# Patient Record
Sex: Male | Born: 1935 | Race: White | Hispanic: No | Marital: Married | Smoking: Former smoker
Health system: Southern US, Community
[De-identification: ages and names within clinical notes are randomized; demographics above are authoritative.]

## PROBLEM LIST (undated history)

## (undated) DIAGNOSIS — M810 Age-related osteoporosis without current pathological fracture: Secondary | ICD-10-CM

## (undated) DIAGNOSIS — G459 Transient cerebral ischemic attack, unspecified: Secondary | ICD-10-CM

## (undated) DIAGNOSIS — Z85038 Personal history of other malignant neoplasm of large intestine: Secondary | ICD-10-CM

## (undated) DIAGNOSIS — Z87442 Personal history of urinary calculi: Secondary | ICD-10-CM

## (undated) DIAGNOSIS — I509 Heart failure, unspecified: Secondary | ICD-10-CM

## (undated) DIAGNOSIS — R197 Diarrhea, unspecified: Secondary | ICD-10-CM

## (undated) DIAGNOSIS — Z8546 Personal history of malignant neoplasm of prostate: Secondary | ICD-10-CM

## (undated) DIAGNOSIS — K520 Gastroenteritis and colitis due to radiation: Secondary | ICD-10-CM

## (undated) DIAGNOSIS — K219 Gastro-esophageal reflux disease without esophagitis: Secondary | ICD-10-CM

## (undated) DIAGNOSIS — Z85828 Personal history of other malignant neoplasm of skin: Secondary | ICD-10-CM

## (undated) DIAGNOSIS — Z8719 Personal history of other diseases of the digestive system: Secondary | ICD-10-CM

## (undated) DIAGNOSIS — G3184 Mild cognitive impairment, so stated: Secondary | ICD-10-CM

## (undated) DIAGNOSIS — F488 Other specified nonpsychotic mental disorders: Secondary | ICD-10-CM

## (undated) DIAGNOSIS — I10 Essential (primary) hypertension: Secondary | ICD-10-CM

## (undated) DIAGNOSIS — K52832 Lymphocytic colitis: Secondary | ICD-10-CM

## (undated) DIAGNOSIS — F329 Major depressive disorder, single episode, unspecified: Secondary | ICD-10-CM

## (undated) HISTORY — DX: Personal history of malignant neoplasm of prostate: Z85.46

## (undated) HISTORY — PX: INGUINAL HERNIA REPAIR: SUR1180

## (undated) HISTORY — DX: Transient cerebral ischemic attack, unspecified: G45.9

## (undated) HISTORY — DX: Age-related osteoporosis without current pathological fracture: M81.0

## (undated) HISTORY — DX: Major depressive disorder, single episode, unspecified: F32.9

## (undated) HISTORY — PX: RIB RESECTION: SHX5077

## (undated) HISTORY — DX: Personal history of urinary calculi: Z87.442

## (undated) HISTORY — DX: Gastro-esophageal reflux disease without esophagitis: K21.9

## (undated) HISTORY — DX: Other specified nonpsychotic mental disorders: F48.8

## (undated) HISTORY — DX: Lymphocytic colitis: K52.832

## (undated) HISTORY — DX: Personal history of other diseases of the digestive system: Z87.19

## (undated) HISTORY — DX: Heart failure, unspecified: I50.9

## (undated) HISTORY — DX: Diarrhea, unspecified: R19.7

## (undated) HISTORY — DX: Personal history of other malignant neoplasm of skin: Z85.828

## (undated) HISTORY — DX: Gastroenteritis and colitis due to radiation: K52.0

## (undated) HISTORY — DX: Personal history of other malignant neoplasm of large intestine: Z85.038

## (undated) HISTORY — DX: Mild cognitive impairment, so stated: G31.84

## (undated) HISTORY — DX: Essential (primary) hypertension: I10

---
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
## 1958-09-27 DIAGNOSIS — Z85038 Personal history of other malignant neoplasm of large intestine: Secondary | ICD-10-CM

## 1958-09-27 HISTORY — DX: Personal history of other malignant neoplasm of large intestine: Z85.038

## 1959-05-29 HISTORY — PX: HEMICOLECTOMY: SHX854

## 1997-04-02 DIAGNOSIS — Z8546 Personal history of malignant neoplasm of prostate: Secondary | ICD-10-CM

## 1997-04-02 HISTORY — DX: Personal history of malignant neoplasm of prostate: Z85.46

## 1997-09-27 HISTORY — PX: PROSTATE BIOPSY: SHX241

## 2004-04-10 ENCOUNTER — Encounter: Payer: Self-pay | Admitting: Gastroenterology

## 2004-05-11 ENCOUNTER — Encounter: Payer: Self-pay | Admitting: Gastroenterology

## 2004-09-27 HISTORY — PX: LUMBAR LAMINECTOMY: SHX95

## 2005-02-17 ENCOUNTER — Encounter: Payer: Self-pay | Admitting: Gastroenterology

## 2005-02-28 ENCOUNTER — Encounter: Payer: Self-pay | Admitting: Emergency Medicine

## 2005-02-28 ENCOUNTER — Inpatient Hospital Stay (HOSPITAL_COMMUNITY): Admission: AD | Admit: 2005-02-28 | Discharge: 2005-03-03 | Payer: Self-pay | Admitting: Internal Medicine

## 2005-03-01 ENCOUNTER — Ambulatory Visit: Payer: Self-pay | Admitting: Internal Medicine

## 2005-03-05 ENCOUNTER — Ambulatory Visit: Payer: Self-pay | Admitting: Internal Medicine

## 2005-03-16 ENCOUNTER — Ambulatory Visit: Payer: Self-pay | Admitting: Internal Medicine

## 2005-03-17 ENCOUNTER — Inpatient Hospital Stay (HOSPITAL_COMMUNITY): Admission: RE | Admit: 2005-03-17 | Discharge: 2005-03-18 | Payer: Self-pay | Admitting: Neurological Surgery

## 2005-03-29 ENCOUNTER — Ambulatory Visit: Payer: Self-pay | Admitting: Internal Medicine

## 2005-04-21 ENCOUNTER — Ambulatory Visit: Payer: Self-pay | Admitting: Internal Medicine

## 2005-07-22 ENCOUNTER — Ambulatory Visit: Payer: Self-pay | Admitting: Internal Medicine

## 2005-08-03 ENCOUNTER — Ambulatory Visit: Payer: Self-pay | Admitting: Internal Medicine

## 2005-08-27 ENCOUNTER — Ambulatory Visit: Payer: Self-pay | Admitting: Family Medicine

## 2005-10-14 ENCOUNTER — Ambulatory Visit: Payer: Self-pay | Admitting: Internal Medicine

## 2005-10-21 ENCOUNTER — Ambulatory Visit: Payer: Self-pay | Admitting: Internal Medicine

## 2006-06-07 ENCOUNTER — Ambulatory Visit: Payer: Self-pay | Admitting: Internal Medicine

## 2006-06-27 ENCOUNTER — Ambulatory Visit: Payer: Self-pay | Admitting: Internal Medicine

## 2006-06-30 ENCOUNTER — Ambulatory Visit: Payer: Self-pay | Admitting: Internal Medicine

## 2006-07-11 ENCOUNTER — Ambulatory Visit: Payer: Self-pay | Admitting: Internal Medicine

## 2006-08-29 ENCOUNTER — Ambulatory Visit: Payer: Self-pay | Admitting: Internal Medicine

## 2006-10-24 ENCOUNTER — Ambulatory Visit: Payer: Self-pay | Admitting: Internal Medicine

## 2006-10-24 LAB — CONVERTED CEMR LAB
ALT: 22 units/L (ref 0–40)
AST: 27 units/L (ref 0–37)
Alkaline Phosphatase: 98 units/L (ref 39–117)
BUN: 22 mg/dL (ref 6–23)
Basophils Absolute: 0.1 10*3/uL (ref 0.0–0.1)
Basophils Relative: 0.9 % (ref 0.0–1.0)
Chloride: 108 meq/L (ref 96–112)
Creatinine, Ser: 1.2 mg/dL (ref 0.4–1.5)
Eosinophils Relative: 4.3 % (ref 0.0–5.0)
GFR calc Af Amer: 75 mL/min
GFR calc non Af Amer: 62 mL/min
HCT: 41.7 % (ref 39.0–52.0)
HDL: 77.4 mg/dL (ref >39.0)
Hemoglobin: 14.5 g/dL (ref 13.0–17.0)
Lymphocytes Relative: 19.5 % (ref 12.0–46.0)
Monocytes Absolute: 0.8 10*3/uL — ABNORMAL HIGH (ref 0.2–0.7)
PSA: 1.02 ng/mL (ref 0.10–4.00)
Platelets: 219 10*3/uL (ref 150–400)
RBC: 4.25 M/uL (ref 4.22–5.81)
RDW: 12.4 % (ref 11.5–14.6)
Sodium: 143 meq/L (ref 135–145)
VLDL: 13 mg/dL (ref 0–40)

## 2006-10-31 ENCOUNTER — Ambulatory Visit: Payer: Self-pay | Admitting: Internal Medicine

## 2007-03-06 ENCOUNTER — Ambulatory Visit: Payer: Self-pay | Admitting: Internal Medicine

## 2007-04-03 ENCOUNTER — Ambulatory Visit: Payer: Self-pay | Admitting: Internal Medicine

## 2007-04-03 ENCOUNTER — Encounter: Payer: Self-pay | Admitting: Internal Medicine

## 2007-04-03 DIAGNOSIS — F329 Major depressive disorder, single episode, unspecified: Secondary | ICD-10-CM

## 2007-04-03 DIAGNOSIS — M545 Low back pain, unspecified: Secondary | ICD-10-CM | POA: Insufficient documentation

## 2007-04-03 DIAGNOSIS — Z85828 Personal history of other malignant neoplasm of skin: Secondary | ICD-10-CM

## 2007-04-03 DIAGNOSIS — Z87442 Personal history of urinary calculi: Secondary | ICD-10-CM

## 2007-04-03 DIAGNOSIS — F3289 Other specified depressive episodes: Secondary | ICD-10-CM

## 2007-04-03 DIAGNOSIS — K5730 Diverticulosis of large intestine without perforation or abscess without bleeding: Secondary | ICD-10-CM | POA: Insufficient documentation

## 2007-04-03 DIAGNOSIS — Z8546 Personal history of malignant neoplasm of prostate: Secondary | ICD-10-CM

## 2007-04-03 DIAGNOSIS — Z8719 Personal history of other diseases of the digestive system: Secondary | ICD-10-CM

## 2007-04-03 DIAGNOSIS — Z85038 Personal history of other malignant neoplasm of large intestine: Secondary | ICD-10-CM | POA: Insufficient documentation

## 2007-04-03 DIAGNOSIS — I10 Essential (primary) hypertension: Secondary | ICD-10-CM

## 2007-04-03 HISTORY — DX: Essential (primary) hypertension: I10

## 2007-04-03 HISTORY — DX: Personal history of urinary calculi: Z87.442

## 2007-04-03 HISTORY — DX: Personal history of other malignant neoplasm of skin: Z85.828

## 2007-04-03 HISTORY — DX: Major depressive disorder, single episode, unspecified: F32.9

## 2007-04-03 HISTORY — DX: Personal history of other diseases of the digestive system: Z87.19

## 2007-04-03 HISTORY — DX: Other specified depressive episodes: F32.89

## 2007-05-01 ENCOUNTER — Ambulatory Visit: Payer: Self-pay | Admitting: Internal Medicine

## 2007-07-04 ENCOUNTER — Ambulatory Visit: Payer: Self-pay | Admitting: Internal Medicine

## 2007-07-04 DIAGNOSIS — R300 Dysuria: Secondary | ICD-10-CM

## 2007-07-04 LAB — CONVERTED CEMR LAB
ALT: 34 units/L (ref 0–53)
BUN: 19 mg/dL (ref 6–23)
Bilirubin, Direct: 0.3 mg/dL (ref 0.0–0.3)
CO2: 27 meq/L (ref 19–32)
Chloride: 109 meq/L (ref 96–112)
Eosinophils Relative: 2.8 % (ref 0.0–5.0)
GFR calc non Af Amer: 61 mL/min
Glucose, Urine, Semiquant: NEGATIVE
MCV: 97.9 fL (ref 78.0–100.0)
Monocytes Absolute: 0.4 10*3/uL (ref 0.2–0.7)
Neutro Abs: 5.3 10*3/uL (ref 1.4–7.7)
Neutrophils Relative %: 77 % (ref 43.0–77.0)
PSA: 0.79 ng/mL (ref 0.10–4.00)
RBC: 4.05 M/uL — ABNORMAL LOW (ref 4.22–5.81)
TSH: 1.6 microintl units/mL (ref 0.35–5.50)
Urobilinogen, UA: NEGATIVE
WBC: 6.8 10*3/uL (ref 4.5–10.5)
pH: 5

## 2007-08-31 ENCOUNTER — Ambulatory Visit: Payer: Self-pay | Admitting: Internal Medicine

## 2007-08-31 DIAGNOSIS — K219 Gastro-esophageal reflux disease without esophagitis: Secondary | ICD-10-CM

## 2007-08-31 DIAGNOSIS — M810 Age-related osteoporosis without current pathological fracture: Secondary | ICD-10-CM | POA: Insufficient documentation

## 2007-08-31 HISTORY — DX: Age-related osteoporosis without current pathological fracture: M81.0

## 2007-08-31 HISTORY — DX: Gastro-esophageal reflux disease without esophagitis: K21.9

## 2007-09-28 HISTORY — PX: EYE SURGERY: SHX253

## 2007-12-13 ENCOUNTER — Encounter: Payer: Self-pay | Admitting: Internal Medicine

## 2007-12-14 ENCOUNTER — Ambulatory Visit: Payer: Self-pay | Admitting: Internal Medicine

## 2007-12-14 LAB — CONVERTED CEMR LAB
ALT: 30 units/L (ref 0–53)
AST: 33 units/L (ref 0–37)
BUN: 21 mg/dL (ref 6–23)
Bilirubin, Direct: 0.4 mg/dL — ABNORMAL HIGH (ref 0.0–0.3)
CO2: 28 meq/L (ref 19–32)
Cholesterol: 193 mg/dL (ref 0–200)
Eosinophils Absolute: 0.3 10*3/uL (ref 0.0–0.6)
GFR calc Af Amer: 82 mL/min
Glucose, Bld: 94 mg/dL (ref 70–99)
HCT: 44.9 % (ref 39.0–52.0)
LDL Cholesterol: 93 mg/dL (ref 0–99)
Lymphocytes Relative: 17.1 % (ref 12.0–46.0)
MCHC: 34.1 g/dL (ref 30.0–36.0)
Monocytes Absolute: 0.7 10*3/uL (ref 0.2–0.7)
Neutro Abs: 5.4 10*3/uL (ref 1.4–7.7)
Neutrophils Relative %: 68.8 % (ref 43.0–77.0)
Total Bilirubin: 1.8 mg/dL — ABNORMAL HIGH (ref 0.3–1.2)
Total CHOL/HDL Ratio: 2.2
VLDL: 13 mg/dL (ref 0–40)
WBC: 7.9 10*3/uL (ref 4.5–10.5)

## 2008-03-06 ENCOUNTER — Telehealth (INDEPENDENT_AMBULATORY_CARE_PROVIDER_SITE_OTHER): Payer: Self-pay | Admitting: *Deleted

## 2008-03-11 ENCOUNTER — Ambulatory Visit: Payer: Self-pay | Admitting: Internal Medicine

## 2008-03-11 DIAGNOSIS — H6120 Impacted cerumen, unspecified ear: Secondary | ICD-10-CM

## 2008-04-04 ENCOUNTER — Telehealth: Payer: Self-pay | Admitting: Internal Medicine

## 2008-05-02 ENCOUNTER — Encounter: Payer: Self-pay | Admitting: Internal Medicine

## 2008-05-27 ENCOUNTER — Ambulatory Visit: Payer: Self-pay | Admitting: Internal Medicine

## 2008-05-30 ENCOUNTER — Encounter: Payer: Self-pay | Admitting: Internal Medicine

## 2008-06-12 ENCOUNTER — Telehealth: Payer: Self-pay | Admitting: Internal Medicine

## 2008-06-20 ENCOUNTER — Ambulatory Visit: Payer: Self-pay | Admitting: Gastroenterology

## 2008-06-20 DIAGNOSIS — K591 Functional diarrhea: Secondary | ICD-10-CM | POA: Insufficient documentation

## 2008-06-20 DIAGNOSIS — R197 Diarrhea, unspecified: Secondary | ICD-10-CM

## 2008-06-20 HISTORY — DX: Diarrhea, unspecified: R19.7

## 2008-07-09 ENCOUNTER — Ambulatory Visit: Payer: Self-pay | Admitting: Gastroenterology

## 2008-07-09 ENCOUNTER — Encounter: Payer: Self-pay | Admitting: Gastroenterology

## 2008-07-11 ENCOUNTER — Encounter: Payer: Self-pay | Admitting: Internal Medicine

## 2008-07-11 ENCOUNTER — Encounter: Payer: Self-pay | Admitting: Gastroenterology

## 2008-08-01 ENCOUNTER — Ambulatory Visit: Payer: Self-pay | Admitting: Internal Medicine

## 2008-08-01 DIAGNOSIS — G3184 Mild cognitive impairment, so stated: Secondary | ICD-10-CM | POA: Insufficient documentation

## 2008-08-01 HISTORY — DX: Mild cognitive impairment of uncertain or unknown etiology: G31.84

## 2008-08-02 ENCOUNTER — Telehealth: Payer: Self-pay | Admitting: Internal Medicine

## 2008-08-06 ENCOUNTER — Telehealth: Payer: Self-pay | Admitting: Internal Medicine

## 2008-08-28 ENCOUNTER — Ambulatory Visit: Payer: Self-pay | Admitting: Gastroenterology

## 2008-08-28 DIAGNOSIS — K5289 Other specified noninfective gastroenteritis and colitis: Secondary | ICD-10-CM | POA: Insufficient documentation

## 2008-08-28 DIAGNOSIS — R1031 Right lower quadrant pain: Secondary | ICD-10-CM

## 2008-08-29 ENCOUNTER — Encounter: Payer: Self-pay | Admitting: Internal Medicine

## 2008-09-02 ENCOUNTER — Telehealth: Payer: Self-pay | Admitting: Internal Medicine

## 2008-09-03 ENCOUNTER — Telehealth: Payer: Self-pay | Admitting: Internal Medicine

## 2008-10-03 ENCOUNTER — Ambulatory Visit: Payer: Self-pay | Admitting: Internal Medicine

## 2008-10-11 ENCOUNTER — Ambulatory Visit: Payer: Self-pay | Admitting: Gastroenterology

## 2008-10-11 DIAGNOSIS — B354 Tinea corporis: Secondary | ICD-10-CM | POA: Insufficient documentation

## 2008-11-25 HISTORY — PX: CATARACT EXTRACTION, BILATERAL: SHX1313

## 2008-11-27 ENCOUNTER — Telehealth: Payer: Self-pay | Admitting: Internal Medicine

## 2008-12-02 ENCOUNTER — Telehealth: Payer: Self-pay | Admitting: Internal Medicine

## 2008-12-09 ENCOUNTER — Telehealth: Payer: Self-pay | Admitting: Gastroenterology

## 2008-12-23 ENCOUNTER — Telehealth (INDEPENDENT_AMBULATORY_CARE_PROVIDER_SITE_OTHER): Payer: Self-pay | Admitting: *Deleted

## 2009-01-01 ENCOUNTER — Ambulatory Visit: Payer: Self-pay | Admitting: Gastroenterology

## 2009-01-09 ENCOUNTER — Telehealth: Payer: Self-pay | Admitting: Gastroenterology

## 2009-01-27 ENCOUNTER — Encounter (INDEPENDENT_AMBULATORY_CARE_PROVIDER_SITE_OTHER): Payer: Self-pay

## 2009-01-29 ENCOUNTER — Telehealth: Payer: Self-pay | Admitting: Gastroenterology

## 2009-03-10 ENCOUNTER — Ambulatory Visit: Payer: Self-pay | Admitting: Internal Medicine

## 2009-03-10 DIAGNOSIS — R0989 Other specified symptoms and signs involving the circulatory and respiratory systems: Secondary | ICD-10-CM

## 2009-03-10 DIAGNOSIS — F488 Other specified nonpsychotic mental disorders: Secondary | ICD-10-CM | POA: Insufficient documentation

## 2009-03-10 DIAGNOSIS — R0609 Other forms of dyspnea: Secondary | ICD-10-CM

## 2009-03-10 HISTORY — DX: Other specified nonpsychotic mental disorders: F48.8

## 2009-03-24 ENCOUNTER — Ambulatory Visit: Payer: Self-pay | Admitting: Internal Medicine

## 2009-05-28 ENCOUNTER — Ambulatory Visit: Payer: Self-pay | Admitting: Internal Medicine

## 2009-06-13 ENCOUNTER — Encounter: Payer: Self-pay | Admitting: Internal Medicine

## 2009-06-30 ENCOUNTER — Ambulatory Visit: Payer: Self-pay | Admitting: Internal Medicine

## 2009-07-03 ENCOUNTER — Ambulatory Visit: Payer: Self-pay | Admitting: Gastroenterology

## 2009-07-15 ENCOUNTER — Ambulatory Visit: Payer: Self-pay | Admitting: Internal Medicine

## 2009-07-22 ENCOUNTER — Telehealth: Payer: Self-pay | Admitting: Internal Medicine

## 2009-07-22 ENCOUNTER — Observation Stay (HOSPITAL_COMMUNITY): Admission: EM | Admit: 2009-07-22 | Discharge: 2009-07-23 | Payer: Self-pay | Admitting: Emergency Medicine

## 2009-07-24 ENCOUNTER — Ambulatory Visit: Payer: Self-pay | Admitting: Internal Medicine

## 2009-07-24 DIAGNOSIS — R079 Chest pain, unspecified: Secondary | ICD-10-CM

## 2009-07-28 ENCOUNTER — Telehealth (INDEPENDENT_AMBULATORY_CARE_PROVIDER_SITE_OTHER): Payer: Self-pay

## 2009-07-28 ENCOUNTER — Telehealth (INDEPENDENT_AMBULATORY_CARE_PROVIDER_SITE_OTHER): Payer: Self-pay | Admitting: *Deleted

## 2009-07-30 ENCOUNTER — Telehealth (INDEPENDENT_AMBULATORY_CARE_PROVIDER_SITE_OTHER): Payer: Self-pay | Admitting: *Deleted

## 2009-07-31 ENCOUNTER — Encounter: Payer: Self-pay | Admitting: Internal Medicine

## 2009-07-31 ENCOUNTER — Encounter (HOSPITAL_COMMUNITY): Admission: RE | Admit: 2009-07-31 | Discharge: 2009-09-24 | Payer: Self-pay | Admitting: Internal Medicine

## 2009-07-31 ENCOUNTER — Ambulatory Visit: Payer: Self-pay

## 2009-08-14 ENCOUNTER — Inpatient Hospital Stay (HOSPITAL_COMMUNITY): Admission: AD | Admit: 2009-08-14 | Discharge: 2009-08-16 | Payer: Self-pay | Admitting: Internal Medicine

## 2009-08-14 ENCOUNTER — Ambulatory Visit: Payer: Self-pay | Admitting: Internal Medicine

## 2009-08-14 DIAGNOSIS — G459 Transient cerebral ischemic attack, unspecified: Secondary | ICD-10-CM

## 2009-08-14 HISTORY — DX: Transient cerebral ischemic attack, unspecified: G45.9

## 2009-08-15 ENCOUNTER — Encounter: Payer: Self-pay | Admitting: Internal Medicine

## 2009-08-15 ENCOUNTER — Telehealth: Payer: Self-pay | Admitting: Internal Medicine

## 2009-08-15 ENCOUNTER — Encounter (INDEPENDENT_AMBULATORY_CARE_PROVIDER_SITE_OTHER): Payer: Self-pay | Admitting: Internal Medicine

## 2009-08-15 ENCOUNTER — Ambulatory Visit: Payer: Self-pay | Admitting: Vascular Surgery

## 2009-08-18 ENCOUNTER — Encounter: Payer: Self-pay | Admitting: Internal Medicine

## 2009-08-19 ENCOUNTER — Ambulatory Visit: Payer: Self-pay | Admitting: Internal Medicine

## 2009-08-22 ENCOUNTER — Ambulatory Visit (HOSPITAL_COMMUNITY): Admission: RE | Admit: 2009-08-22 | Discharge: 2009-08-22 | Payer: Self-pay | Admitting: Internal Medicine

## 2009-08-22 ENCOUNTER — Ambulatory Visit: Payer: Self-pay | Admitting: Internal Medicine

## 2009-08-22 ENCOUNTER — Ambulatory Visit: Payer: Self-pay

## 2009-08-22 ENCOUNTER — Encounter: Payer: Self-pay | Admitting: Internal Medicine

## 2009-08-26 ENCOUNTER — Telehealth: Payer: Self-pay | Admitting: Gastroenterology

## 2009-08-29 ENCOUNTER — Telehealth: Payer: Self-pay | Admitting: Internal Medicine

## 2009-09-09 ENCOUNTER — Ambulatory Visit: Payer: Self-pay | Admitting: Internal Medicine

## 2009-09-16 ENCOUNTER — Encounter (INDEPENDENT_AMBULATORY_CARE_PROVIDER_SITE_OTHER): Payer: Self-pay | Admitting: *Deleted

## 2009-09-16 ENCOUNTER — Ambulatory Visit: Payer: Self-pay | Admitting: Internal Medicine

## 2009-10-10 ENCOUNTER — Ambulatory Visit: Payer: Self-pay | Admitting: Internal Medicine

## 2009-10-10 ENCOUNTER — Telehealth: Payer: Self-pay | Admitting: Internal Medicine

## 2009-10-10 DIAGNOSIS — S61409A Unspecified open wound of unspecified hand, initial encounter: Secondary | ICD-10-CM | POA: Insufficient documentation

## 2009-10-13 ENCOUNTER — Telehealth: Payer: Self-pay | Admitting: Gastroenterology

## 2009-11-05 ENCOUNTER — Ambulatory Visit: Payer: Self-pay | Admitting: Gastroenterology

## 2009-11-24 ENCOUNTER — Telehealth: Payer: Self-pay | Admitting: Internal Medicine

## 2009-12-08 ENCOUNTER — Encounter (INDEPENDENT_AMBULATORY_CARE_PROVIDER_SITE_OTHER): Payer: Self-pay | Admitting: *Deleted

## 2010-01-13 ENCOUNTER — Ambulatory Visit: Payer: Self-pay | Admitting: Internal Medicine

## 2010-01-14 ENCOUNTER — Ambulatory Visit: Payer: Self-pay | Admitting: Gastroenterology

## 2010-01-14 DIAGNOSIS — K520 Gastroenteritis and colitis due to radiation: Secondary | ICD-10-CM

## 2010-01-14 HISTORY — DX: Gastroenteritis and colitis due to radiation: K52.0

## 2010-03-16 ENCOUNTER — Ambulatory Visit: Payer: Self-pay | Admitting: Gastroenterology

## 2010-05-15 ENCOUNTER — Ambulatory Visit: Payer: Self-pay | Admitting: Internal Medicine

## 2010-06-11 ENCOUNTER — Telehealth: Payer: Self-pay | Admitting: Internal Medicine

## 2010-06-16 ENCOUNTER — Ambulatory Visit: Payer: Self-pay | Admitting: Internal Medicine

## 2010-09-01 ENCOUNTER — Ambulatory Visit: Payer: Self-pay | Admitting: Internal Medicine

## 2010-10-12 ENCOUNTER — Telehealth: Payer: Self-pay | Admitting: Gastroenterology

## 2010-10-18 ENCOUNTER — Encounter: Payer: Self-pay | Admitting: Neurological Surgery

## 2010-10-25 LAB — CONVERTED CEMR LAB
ALT: 29 units/L (ref 0–53)
ALT: 36 units/L (ref 0–53)
AST: 31 units/L (ref 0–37)
AST: 31 units/L (ref 0–37)
Albumin: 4.2 g/dL (ref 3.5–5.2)
BUN: 18 mg/dL (ref 6–23)
BUN: 27 mg/dL — ABNORMAL HIGH (ref 6–23)
Basophils Absolute: 0 10*3/uL (ref 0.0–0.1)
Basophils Relative: 0 % (ref 0.0–3.0)
Basophils Relative: 0.8 % (ref 0.0–3.0)
Bilirubin, Direct: 0.2 mg/dL (ref 0.0–0.3)
Cholesterol: 173 mg/dL (ref 0–200)
Cortisol, Plasma: 3.9 ug/dL
Eosinophils Absolute: 0.1 10*3/uL (ref 0.0–0.7)
Eosinophils Relative: 1.3 % (ref 0.0–5.0)
Eosinophils Relative: 1.8 % (ref 0.0–5.0)
GFR calc non Af Amer: 61.13 mL/min (ref >60)
HCT: 40 % (ref 39.0–52.0)
HDL: 86.3 mg/dL (ref >39.00)
Hemoglobin: 13.3 g/dL (ref 13.0–17.0)
Lymphocytes Relative: 13.8 % (ref 12.0–46.0)
Lymphs Abs: 1.1 10*3/uL (ref 0.7–4.0)
MCHC: 34 g/dL (ref 30.0–36.0)
Monocytes Absolute: 0.6 10*3/uL (ref 0.1–1.0)
Neutro Abs: 5.6 10*3/uL (ref 1.4–7.7)
Neutro Abs: 5.8 10*3/uL (ref 1.4–7.7)
Neutrophils Relative %: 76.9 % (ref 43.0–77.0)
Potassium: 4.8 meq/L (ref 3.5–5.1)
RBC: 3.81 M/uL — ABNORMAL LOW (ref 4.22–5.81)
RBC: 3.88 M/uL — ABNORMAL LOW (ref 4.22–5.81)
RDW: 12.5 % (ref 11.5–14.6)
Sodium: 141 meq/L (ref 135–145)
TSH: 1 microintl units/mL (ref 0.35–5.50)
Total Bilirubin: 1.6 mg/dL — ABNORMAL HIGH (ref 0.3–1.2)
Total Protein: 6.3 g/dL (ref 6.0–8.3)

## 2010-10-27 NOTE — Assessment & Plan Note (Signed)
Summary: f/u--ch.   History of Present Illness Visit Type: Follow-up Visit Primary GI MD: Elie Goody MD [facility] Primary Provider: Thane Edu, MD Requesting Provider: na Chief Complaint: Patient here for f/u colitis flare. He states that he still has frequent bowel movements that are occasionally loose. He denies any appearance of blood in the stool. There is no abdominal pain, nausea or vomiting and no fever. Patient does complain of frequent soiling of his undergarments History of Present Illness:   [name] returns today with his wife. He reports about 8-10 bowel movements a day. He has pellet-like stools with a watery component. He has had problems with frequent stools since the time of his radiation therapy. His diarrhea has improved since using Entocort at the full dosage.   GI Review of Systems      Denies abdominal pain, acid reflux, belching, bloating, chest pain, dysphagia with liquids, dysphagia with solids, heartburn, loss of appetite, nausea, vomiting, vomiting blood, weight loss, and  weight gain.      Reports diarrhea and  fecal incontinence.     Denies anal fissure, black tarry stools, change in bowel habit, constipation, diverticulosis, heme positive stool, hemorrhoids, irritable bowel syndrome, jaundice, light color stool, liver problems, rectal bleeding, and  rectal pain.   Current Medications (verified): 1)  Lexapro 20 Mg Tabs (Escitalopram Oxalate) .... Take 1 Tablet By Mouth Once A Day 2)  Namenda 10 Mg Tabs (Memantine Hcl) .... One Twice Daily 3)  Preservision/lutein  Caps (Multiple Vitamins-Minerals) .Marland Kitchen.. 1 Tablet By Mouth Two Times A Day 4)  Fish Oil 1000 Mg Caps (Omega-3 Fatty Acids) .... Take 2 Cap By Mouth Once Daily 5)  Entocort Ec 3 Mg Xr24h-Cap (Budesonide) .... 3 Capsules By Mouth Every Morning 6)  Plavix 75 Mg Tabs (Clopidogrel Bisulfate) .Marland Kitchen.. 1 Once Daily 7)  Cvs Acid Controller Max St 20 Mg Tabs (Famotidine) .... One At Bedtime 8)  Exelon 9.5  Mg/24hr Pt24 (Rivastigmine) .... 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 father died age 74, [place].  Mother died age 58, liver cancer 3 brothers, one sister possible cardiac disease, COPD, and liver cancer No FH of Colon Cancer:  Social History: Reviewed history from 01/01/2009 and no changes required. Married Occupation: retired Alcohol Use - yes 3 ounces per day beer and liquor-sometimes more Daily Caffeine Use 2 cup per day Illicit Drug Use - no Patient gets regular exercise. Patient is a former smoker. -stopped 1972  Review of Systems       The patient complains of arthritis/joint pain, fatigue, hearing problems, skin rash, urination - excessive, urine leakage, and voice change.  The patient denies allergy/sinus, anemia, anxiety-new, back pain, blood in urine, breast changes/lumps, change in vision, confusion, cough, coughing up  blood, depression-new, fainting, fever, headaches-new, heart murmur, heart rhythm changes, itching, menstrual pain, muscle pains/cramps, night sweats, nosebleeds,  pregnancy symptoms, shortness of breath, sleeping problems, sore throat, swelling of feet/legs, swollen lymph glands, thirst - excessive , urination - excessive , urination changes/pain, and vision changes.    Vital Signs:  Patient profile:   77 year old male Height:      67 inches Weight:      158.13 pounds BSA:     1.83 Pulse rate:   80 / minute Pulse rhythm:   regular BP sitting:   94 / 50  (left arm)  Vitals Entered By: Hortense Ramal CMA Duncan Dull) (January 14, 2010 9:54 AM)  Physical Exam  General:  Well developed, well nourished, no acute distress. Head:  Normocephalic and atraumatic. Eyes:  PERRLA, no icterus. Mouth:  No deformity or lesions, dentition normal. Lungs:  Clear throughout to auscultation. Heart:  Regular rate and rhythm; no murmurs, rubs,  or bruits. Abdomen:  Soft, nontender and nondistended. No masses, hepatosplenomegaly or hernias noted. Normal bowel sounds. Psych:  Alert and cooperative. Normal mood and affect.  Impression & Recommendations:  Problem # 1:  OTH&UNSPEC NONINFECTIOUS GASTROENTERITIS&COLITIS (ICD-558.9) Continue Entocort 9 mg daily. Multiple factors contributing to his pellet-like stools and irregular bowel pattern. Begin Benefiber once or twice daily and Align.  Problem # 2:  DIARRHEA (ICD-787.91) See above. Possible functional diarrhea.  Problem # 3:  COLON CANCER, HX OF (ICD-V10.05)  Problem # 4:  RADIATION PROCTITIS (ICD-558.1) See above. This is likely contributing to his chronic bowel function problems.  Patient Instructions: 1)  Start Benefiber once daily. 2)  High Fiber, Low Fat  Healthy Eating Plan brochure given.  3)  Start Align one tablet by mouth two times a day x 1 month then reduce to one tablet by mouth once daily until appt with Dr. Russella Dar. 4)  Please schedule a follow-up appointment in  8 weeks.  5)  The medication list was reviewed and reconciled.  All changed / newly prescribed medications were explained.  A complete  medication list was provided to the patient / caregiver.

## 2010-10-27 NOTE — Progress Notes (Signed)
Summary: Diarrhea  Phone Note Call from Patient Call back at Home Phone [phone]   Caller: Spouse Call For: Dr Russella Dar Reason for Call: Talk to Nurse Summary of Call: Concerned about the diarrhea he has been having these last couple of days. Initial call taken by: Leanor Kail [facility],  October 13, 2009 1:14 PM  Follow-up for Phone Call        Patient  has been taking Entocort 6 mg daily over the weekend developed diarrhea and urgency.  Imodium is not helping .  Per Dr Russella Dar increase Entocort to 9 mg daily and rev in Feb . Patient scheduled for 11-05-09 1:30.  Wife aware of the above Follow-up by: Darcey Nora RN, CGRN,  October 13, 2009 1:31 PM

## 2010-10-27 NOTE — Assessment & Plan Note (Signed)
Summary: pt will come in fasting/njr---PT [facility] // RS   Vital Signs:  Patient profile:   77 year old male Height:      67 inches Weight:      153 pounds BMI:     24.05 Temp:     98.0 degrees F oral BP sitting:   110 / 70  (right arm) Cuff size:   regular  Vitals Entered By: Duard Brady LPN (June 16, 2010 9:33 AM) CC: cpx - doing well  Is Patient Diabetic? No Flu Vaccine Consent Questions     Do you have a history of severe allergic reactions to this vaccine? no    Any prior history of allergic reactions to egg and/or gelatin? no    Do you have a sensitivity to the preservative Thimersol? no    Do you have a past history of Guillan-Barre Syndrome? no    Do you currently have an acute febrile illness? no    Have you ever had a severe reaction to latex? no    Vaccine information given and explained to patient? yes    Are you currently pregnant? no    Lot Number:[id number]   Exp Date:03/27/2011   Site Given  Left Deltoid IM   Primary Care Provider:  Thane Edu, MD  CC:  cpx - doing well .  History of Present Illness: 77 year old patient who is seen today for a comprehensive annual examination.  He was hospitalized twice late last year.  Initial hospital admission was for chest pain, and a subsequent to her medicine stress test was normal.  Approximately 10 months ago.  He was hospitalized for a right brain stroke and now is on Plavix.  Complaints today include fatigue, and daytime sleepiness. he has treated hypertension.  History depression, osteoporosis, and remote history of prostate cancer.  He is on medications for cognitive impairment. Here for Medicare AWV:  1.   Risk factors based on Past M, S, F history:  risk factors include history of hypertension, age, and history of cerebrovascular disease 2.   Physical Activities: fairly sedentary, but does do low-level exercises daily 3.   Depression/mood: history depression, presently on medication 4.   Hearing:  moderate impairment using hearing aids bilaterally 5.   ADL's: independent in all aspects of daily living 6.   Fall Risk: moderate due to age 61.   Home Safety: no problems identified 8.   Height, weight, &visual acuity:height and weight stable.  No difficulty with visual acuity 9.   Counseling: heart healthy diet, and more regular exercise encouraged 10.   Labs ordered based on risk factors: laboratory profile will be reviewed, including lipid panel, and in view of a history of prostate cancer.  The PSA will also be reviewed 11.           Referral Coordination-not appropriate at this time 12.           Care Plan- continue present medical regimen; more vigorous exercise regimen encouraged 13.            Cognitive Assessment-alert and oriented normal affect; mild cognitive impairment.     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 Right hemicolectomy 1960s Lumbar laminectomy 2006 status post Radiation for prostate cancer, 1999 Status post surgery for empyema, 1934 inguinal hernia repair, 1967, and 1999 OD eye surgery 2009 Rib resection as a child (due to infection in the lung) Cataract Extraction 11/2008-bilateral Stress Myoview 07-31-2009  Family History: Reviewed history from 11/05/2009 and no changes required. father died age 28, [place].  Mother died age 29, liver cancer 3 brothers, one sister possible cardiac disease, COPD,  and liver cancer No FH of Colon Cancer:  Social History: Reviewed history from 01/14/2010 and no changes required. Married Occupation: retired Alcohol Use - yes 3 ounces per day beer and liquor-sometimes more Daily Caffeine Use 2 cup per day Illicit Drug Use - no Patient gets regular exercise. Patient is a former smoker. -stopped 1972  Review of Systems       The patient complains of anorexia, prolonged cough, muscle weakness, and depression.  The patient denies fever, weight loss, weight gain, vision loss, decreased hearing, hoarseness, chest pain, syncope, dyspnea on exertion, peripheral edema, headaches, hemoptysis, abdominal pain, melena, hematochezia, severe indigestion/heartburn, hematuria, incontinence, genital sores, suspicious skin lesions, transient blindness, difficulty walking, unusual weight change, abnormal bleeding, enlarged lymph nodes, angioedema, breast masses, and testicular masses.    Physical Exam  General:  underweight appearing.  110/70underweight appearing.   Head:  Normocephalic and atraumatic without obvious abnormalities. No apparent alopecia or balding. Eyes:  No corneal or conjunctival inflammation noted. EOMI. Perrla. Funduscopic exam benign, without hemorrhages, exudates or papilledema. Vision grossly normal. Ears:  External ear exam shows no significant lesions or deformities.  Otoscopic examination reveals clear canals, tympanic membranes are intact bilaterally without bulging, retraction, inflammation or discharge. Hearing is grossly normal bilaterally. Nose:  External nasal examination shows no deformity or inflammation. Nasal mucosa are pink and moist without lesions or exudates. Mouth:  Oral mucosa and oropharynx without lesions or exudates.  Teeth in good repair. Neck:  No deformities, masses, or tenderness noted. Chest Wall:  No deformities, masses, tenderness or gynecomastia noted. Breasts:  No masses or gynecomastia noted Lungs:  bibasilar  crackles Heart:  Normal rate and regular rhythm. S1 and S2 normal without gallop, murmur, click, rub or other extra sounds. Abdomen:  Bowel sounds positive,abdomen soft and non-tender without masses, organomegaly or hernias noted. Rectal:  No external abnormalities noted. Normal sphincter tone. No rectal masses or tenderness. stool hematest negative Genitalia:  atrophic changes only Prostate:  nonpalpable Msk:  No deformity or scoliosis noted of thoracic or lumbar spine.   Pulses:  full pedal pulses Extremities:  No clubbing, cyanosis, edema, or deformity noted with normal full range of motion of all joints.   Neurologic:  alert & oriented X3, cranial nerves II-XII intact, strength normal in all extremities, sensation intact to light touch, and gait normal.  alert & oriented X3, cranial nerves II-XII intact, strength normal in all extremities, sensation intact to light touch, and gait normal.   Skin:  marked solar changes and numerous biopsy and excisional scar Cervical Nodes:  No lymphadenopathy noted Axillary Nodes:  No palpable lymphadenopathy Inguinal Nodes:  No significant adenopathy Psych:  Cognition and judgment appear intact. Alert and cooperative with normal attention span and concentration. No apparent delusions, illusions, hallucinations   Impression & Recommendations:  Problem # 1:  Preventive Health Care (ICD-V70.0)  Orders: Medicare -1st Annual Wellness Visit [phone]) Venipuncture [phone]) TLB-Lipid  Panel (80061-LIPID) TLB-BMP (Basic Metabolic Panel-BMET) (80048-METABOL) TLB-Hepatic/Liver Function Pnl (80076-HEPATIC) TLB-TSH (Thyroid Stimulating Hormone) (84443-TSH) TLB-CBC Platelet - w/Differential (85025-CBCD) Specimen Handling (16109)  Complete Medication List: 1)  Lexapro 20 Mg Tabs (Escitalopram oxalate) .... Take 1 tablet by mouth once a day 2)  Namenda 10 Mg Tabs (Memantine hcl) .... One twice daily ( takes one by mouth once daily, will go home and check  bottle) 3)  Preservision/lutein Caps (Multiple vitamins-minerals) .Marland Kitchen.. 1 tablet by mouth two times a day 4)  Fish Oil 1000 Mg Caps (Omega-3 fatty acids) .... Take 2 cap by mouth once daily 5)  Entocort Ec 3 Mg Xr24h-cap (Budesonide) .... 3 capsules by mouth every morning 6)  Plavix 75 Mg Tabs (Clopidogrel bisulfate) .Marland Kitchen.. 1 once daily 7)  Cvs Acid Controller Max St 20 Mg Tabs (Famotidine) .... One at bedtime 8)  Exelon 9.5 Mg/24hr Pt24 (Rivastigmine) .... Apply one patch daily 9)  Align Caps (Probiotic product) .... Qd 10)  Diphenoxylate-atropine 2.5-0.025 Mg Tabs (Diphenoxylate-atropine) .... One every 6 hours for diarrhea  Other Orders: Admin 1st Vaccine (60454) Flu Vaccine 56yrs + (09811) EKG w/ Interpretation (93000) TLB-PSA (Prostate Specific Antigen) (84153-PSA)  Patient Instructions: 1)  Please schedule a follow-up appointment in 3 months. 2)  Limit your Sodium (Salt). 3)  It is important that you exercise regularly at least 20 minutes 5 times a week. If you develop chest pain, have severe difficulty breathing, or feel very tired , stop exercising immediately and seek medical attention. Prescriptions: DIPHENOXYLATE-ATROPINE 2.5-0.025 MG TABS (DIPHENOXYLATE-ATROPINE) one every 6 hours for diarrhea  #40 x 3   Entered and Authorized by:   Gordy Savers  MD   Signed by:   Gordy Savers  MD on 06/16/2010   Method used:   Print then Give to Patient   RxID:   [id number] EXELON 9.5 MG/24HR PT24 (RIVASTIGMINE) apply one patch daily  #90 x 3   Entered and Authorized by:   Gordy Savers  MD   Signed by:   Gordy Savers  MD on 06/16/2010   Method used:   Print then Give to Patient   RxID:   [id number] PLAVIX 75 MG TABS (CLOPIDOGREL BISULFATE) 1 once daily  #90 x 4   Entered and Authorized by:   Gordy Savers  MD   Signed by:   Gordy Savers  MD on 06/16/2010   Method used:   Print then Give to Patient   RxID:   [id number] ENTOCORT EC  3 MG XR24H-CAP (BUDESONIDE) 3 capsules by mouth every morning  #90 x 5   Entered and Authorized by:   Gordy Savers  MD   Signed by:   Gordy Savers  MD on 06/16/2010   Method used:   Print then Give to Patient   RxID:   [id number] NAMENDA 10 MG TABS (MEMANTINE HCL) one twice daily ( takes one by mouth once daily, will go home and check bottle)  #90 x 6   Entered and Authorized by:   Gordy Savers  MD   Signed by:   Gordy Savers  MD on 06/16/2010   Method used:   Print then Give to Patient   RxID:   [id number] LEXAPRO 20 MG TABS (ESCITALOPRAM OXALATE) Take 1 tablet by mouth once a day  #90 x 3   Entered and Authorized by:   Gordy Savers  MD   Signed by:   Gordy Savers  MD  on 06/16/2010   Method used:   Print then Give to Patient   RxID:   [id number]

## 2010-10-27 NOTE — Progress Notes (Signed)
Summary: med confusion  Phone Note Call from Patient Call back at Home Phone [phone]   Caller: woman Call For: kwia Summary of Call: Paper says cephalexin two times a day, I'm sure he said one day.  Gave 1 sample Avelox 4mg  & he said one a day.  He had today's dose.   Confused.  Please call. Initial call taken by: Rudy Jew, RN,  October 10, 2009 12:54 PM  Follow-up for Phone Call        different meds; samples are once daily but Rx is twice daily Follow-up by: Gordy Savers  MD,  October 10, 2009 1:44 PM  Additional Follow-up for Phone Call Additional follow up Details #1::        Phone Call Completed Additional Follow-up by: Rudy Jew, RN,  October 10, 2009 2:51 PM

## 2010-10-27 NOTE — Assessment & Plan Note (Signed)
Summary: diarrhea/colitis flare/sheri   History of Present Illness Visit Type: Follow-up Visit Primary GI MD: Elie Goody MD [facility] Primary Provider: Beverely Low, MD Requesting Provider: na Chief Complaint: Patient having some diarrhea since the middle of Jan. Patient was instructed to increase his Entocort back to 3 tabs a day but he forgot and has only been taking two a day. He started taking three a day of his Entocort today.  He state that everytime he has a meal he has a bowel movement. He denies any blood in his stool, or abdominal pain.  History of Present Illness:   [name] returns today with his wife. He is having persistent problems with watery, nonbloody diarrhea. He dtates he is having about 5 loose stools per day. Unfortunately, he was not able to follow our recommendation on 1/17 to increase to 9 mg a day and he is been taking 6 mg a day. He did increased Entocort to 9 mg this morning when his wife noted the error. He is not been using Lomotil.   GI Review of Systems      Denies abdominal pain, acid reflux, belching, bloating, chest pain, dysphagia with liquids, dysphagia with solids, heartburn, loss of appetite, nausea, vomiting, vomiting blood, weight loss, and  weight gain.      Reports diarrhea.     Denies anal fissure, black tarry stools, change in bowel habit, constipation, diverticulosis, fecal incontinence, heme positive stool, hemorrhoids, irritable bowel syndrome, jaundice, light color stool, liver problems, rectal bleeding, and  rectal pain.   Current Medications (verified): 1)  Lexapro 20 Mg Tabs (Escitalopram Oxalate) .... Take 1 Tablet By Mouth Once A Day 2)  Namenda 10 Mg Tabs (Memantine Hcl) .... One Daily 3)  Preservision/lutein  Caps (Multiple Vitamins-Minerals) .Marland Kitchen.. 1 Tablet By Mouth Two Times A Day 4)  Fish Oil 1000 Mg Caps (Omega-3 Fatty Acids) .... Take 2 Cap By Mouth Once Daily 5)  Lomotil 2.5-0.025 Mg Tabs (Diphenoxylate-Atropine) .Marland Kitchen.. 1 Q4h  As Needed For Diarrhea 6)  Entocort Ec 3 Mg Xr24h-Cap (Budesonide) .... 3 Capsules By Mouth Every Morning 7)  Plavix 75 Mg Tabs (Clopidogrel Bisulfate) .Marland Kitchen.. 1 Once Daily 8)  Cvs Acid Controller Max St 20 Mg Tabs (Famotidine) .... 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 Right hemicolectomy 1960s Lumbar laminectomy 2006 status post Radiation for prostate cancer, 1999 Status post surgery for empyema, 1934 inguinal hernia repair, 1967, and 1999 OD eye surgery 2009 Rib resection as a child (due to infection in the lung) Cataract Extraction 11/2008-bilateral Stress Myoview 2009-08-19  Family History: father died age 22, MI.  Mother died age 56, liver cancer 3 brothers, one sister possible cardiac disease, COPD, and liver cancer No FH of Colon Cancer:  Social History: Reviewed history from 01/01/2009 and no changes required. Married Occupation: retired Alcohol Use - yes 3 0unces per day beer and liquor-sometimes more Daily Caffeine Use 2 cup per day Illicit Drug Use - no Patient gets regular exercise. Patient is a former smoker. -stopped 1972  Review of Systems  The patient complains of arthritis/joint pain, change in vision, confusion, cough, fatigue, shortness of breath, and urination - excessive.         The pertinent positives and negatives are noted as above and in the HPI. All other ROS were reviewed and were negative.   Vital  Signs:  Patient profile:   77 year old male Height:      67 inches Weight:      159.6 pounds Pulse rate:   64 / minute Pulse rhythm:   regular BP sitting:   130 / 62  (right arm) Cuff size:   regular  Vitals Entered By: Harlow Mares CMA Duncan Dull) (November 05, 2009 1:36 PM)  Physical Exam  General:  Well developed, well nourished, no acute distress. Head:  Normocephalic and atraumatic. Eyes:  PERRLA, no icterus. Mouth:  No deformity or lesions, dentition normal. Lungs:  Clear throughout to auscultation. Heart:  Regular rate and rhythm; no murmurs, rubs,  or bruits. Abdomen:  Soft, nontender and nondistended. No masses, hepatosplenomegaly or hernias noted. Normal bowel sounds. Psych:  Alert and cooperative. Normal mood and affect.  Impression & Recommendations:  Problem # 1:  OTH&UNSPEC NONINFECTIOUS GASTROENTERITIS&COLITIS (ICD-558.9) Lymphocytic colitis. Symptoms not adequately controlled. Continue Entocort 9 mg daily for 2 months and then we will consider a taper. If 9 mg is the dose needed to control symptoms, he can remain on this long-term. Lomotil q.6 hours p.r.n.  Problem # 2:  COLON CANCER, HX OF (ICD-V10.05)  Patient Instructions: 1)  Pick up your prescription at your pharmacy.  2)  Please continue current medications. 3)  Please schedule a follow-up appointment in 8 weeks.  4)  Copy sent to : Beverely Low, MD 5)  The medication list was reviewed and reconciled.  All changed / newly prescribed medications were explained.  A complete medication list was provided to the patient / caregiver.  Prescriptions: ENTOCORT EC 3 MG XR24H-CAP (BUDESONIDE) 3 capsules by mouth every morning  #90 x 2   Entered by:   Christie Nottingham CMA (AAMA)   Authorized by:   Meryl Dare MD [facility]   Signed by:   Meryl Dare MD [facility] on 11/05/2009   Method used:   Electronically to        [facility]. [address] [phone]* (retail)       [address]       [place], [place]  [address]       Ph: [phone] or [phone]       Fax: [phone]   RxID:   [id number] LOMOTIL 2.5-0.025 MG TABS (DIPHENOXYLATE-ATROPINE) one tablet by mouth every 6 hours as needed for diarrhea  #30 x 1   Entered by:   Christie Nottingham CMA (AAMA)   Authorized by:   Meryl Dare MD [facility]   Signed by:   Christie Nottingham CMA (AAMA) on 11/05/2009   Method used:   Printed then faxed to ...       [facility] [address] [phone]* (retail)       [address]       [place], [place]  [address]       Ph: [phone] or [phone]       Fax: [phone]   RxID:   [id number]

## 2010-10-27 NOTE — Assessment & Plan Note (Signed)
Summary: 3 month fup//ccm   Vital Signs:  [name] profile:   77 year old male Weight:      158 pounds Temp:     97.5 degrees F oral BP sitting:   120 / 70  (left arm) Cuff size:   regular  Vitals Entered By: Duard Brady LPN (September 01, 2010 10:48 AM) CC: 3 month rov---doing ok Is [name] Diabetic? No   Primary Care Provider:  Thane Edu, MD  CC:  3 month rov---doing ok.  History of Present Illness: 77 year old [name] who is seen today for follow-up.  He continues to complain of dyspnea on exertion.  13 months ago.  He had a 2-D echocardiogram that revealed an ejection fraction of 60 to 65%.  His weight is up 5 pounds compared to his visit 3 months ago.  He does have some peripheral edema.  He has a history of hypertension, presently controlled off medications.  He denies any PND or orthopnea .  He has a history [place].  Her neurovascular disease, which has been stable.  he continues on Plavix therapy.  He has a history of weakness, mild cognitive impairment, and gastroesophageal reflux disease.  He does have a remote history of hypertension, presently controlled off medications  Allergies (verified): No Known Drug Allergies  Past History:  Past Medical History: Reviewed history from 06/16/2010 and no changes required. 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  The [name] denies anorexia, fever, weight loss, weight gain, vision loss, decreased hearing, hoarseness, chest pain, syncope, prolonged cough, headaches, hemoptysis, abdominal pain, melena, hematochezia, severe indigestion/heartburn, hematuria, incontinence, genital sores, muscle weakness, suspicious skin lesions, transient blindness, difficulty walking, depression, unusual weight change, abnormal bleeding, enlarged lymph nodes, angioedema, breast masses, and testicular masses.    Physical Exam  General:  Well-developed,well-nourished,in no acute distress; alert,appropriate and cooperative throughout examination;  blood pressure 100/60 Head:  Normocephalic and atraumatic without obvious abnormalities. No apparent alopecia or balding. Mouth:  Oral mucosa and oropharynx without lesions or exudates.  Teeth in good repair. Neck:  No deformities, masses, or tenderness noted. Lungs:  rales lower one third lung field Heart:  Normal rate and regular rhythm. S1 and S2 normal without gallop, murmur, click, rub or other extra sounds. Abdomen:  Bowel sounds positive,abdomen soft and non-tender without masses, organomegaly or hernias noted. Extremities:  2+ left pedal edema and 2+ right pedal edema.     Impression & Recommendations:  Problem # 1:  DYSPNEA ON EXERTION (ICD-786.09)  [name] has some worsening pulmonary rales, peripheral edema, and a 5-pound weight gain.  Will check a BNP if elevated will consider low-dose diuretic therapy, but will hold now in view of his low normal blood pressure  Orders: Venipuncture (86578) TLB-BNP (B-Natriuretic Peptide) (  83880-BNPR)  Problem # 2:  GERD (ICD-530.81)  His updated medication list for this  problem includes:    Cvs Acid Controller Max St 20 Mg Tabs (Famotidine) ..... One at bedtime  Complete Medication List: 1)  Lexapro 20 Mg Tabs (Escitalopram oxalate) .... Take 1 tablet by mouth once a day 2)  Namenda 10 Mg Tabs (Memantine hcl) .... One twice daily ( takes one by mouth once daily, will go home and check bottle) 3)  Preservision/lutein Caps (Multiple vitamins-minerals) .Marland Kitchen.. 1 tablet by mouth two times a day 4)  Fish Oil 1000 Mg Caps (Omega-3 fatty acids) .... Take 2 cap by mouth once daily 5)  Entocort Ec 3 Mg Xr24h-cap (Budesonide) .... 3 capsules by mouth every morning 6)  Plavix 75 Mg Tabs (Clopidogrel bisulfate) .Marland Kitchen.. 1 once daily 7)  Cvs Acid Controller Max St 20 Mg Tabs (Famotidine) .... One at bedtime 8)  Exelon 9.5 Mg/24hr Pt24 (Rivastigmine) .... Apply one patch daily 9)  Align Caps (Probiotic product) .... Qd 10)  Diphenoxylate-atropine 2.5-0.025 Mg Tabs (Diphenoxylate-atropine) .... One every 6 hours for diarrhea  [name] Instructions: 1)  Please schedule a follow-up appointment in 3 months. 2)  Limit your Sodium (Salt) to less than 2 grams a day(slightly less than 1/2 a teaspoon) to prevent fluid retention, swelling, or worsening of symptoms. 3)  It is important that you exercise regularly at least 20 minutes 5 times a week. If you develop chest pain, have severe difficulty breathing, or feel very tired , stop exercising immediately and seek medical attention.   Orders Added: 1)  Est. [name] Level IV [[address]] 2)  Venipuncture [[address]] 3)  TLB-BNP (B-Natriuretic Peptide) [83880-BNPR]

## 2010-10-27 NOTE — Letter (Signed)
Summary: Office Visit Letter  [facility]  [address] [place], [place] [address]   Phone: [phone]  Fax: [phone]      December 08, 2009 MRN: [id number]   [facility] [address] [place], [place]  [address]   Dear Mr. Kelm,   According to our records, it is time for you to schedule a follow-up office visit with [place].   At your convenience, please call [phone] (option #2)to schedule an office visit. If you have any questions, concerns, or feel that this letter is in error, we would appreciate your call.   Sincerely,  Judie Petit T. Russella Dar, M.D.  [facility] [phone]

## 2010-10-27 NOTE — Progress Notes (Signed)
Summary: Dr. Fabio Pierce, DDS called. Wants to discuss med concerns  Phone Note From Other Clinic Call back at [phone]  Dr. Fabio Pierce   Caller: Dr Fabio Pierce - DDS Summary of Call: Pt is in Dr. Beatriz Stallion office for initial dentist appt. Dr. Earl Many wants to know if there is  any need for antibiotic pre med or if there are any medical concerns that Dr. Fabio Pierce needs to be made aware of, in order to treat pt.  Initial call taken by: Lucy Antigua,  November 24, 2009 11:40 AM  Follow-up for Phone Call        called and left message Follow-up by: Gordy Savers  MD,  November 24, 2009 12:51 PM

## 2010-10-27 NOTE — Assessment & Plan Note (Signed)
Summary: 8 WEEK F/U..AM.   History of Present Illness Visit Type: Follow-up Visit Primary GI MD: Elie Goody MD [facility] Primary Provider: Thane Edu, MD Requesting Provider: na Chief Complaint: Pateint here to following up on his colitis he denies any diarrhea or loose stools, no blood in his stools or abdominal pain.  History of Present Illness:   [name] returns today with his daughter. His diarrhea is under complete control since increasing Entocort to 9 mg daily. He states he has one formed bowel movement per day. His appetite is good and his weight is stable.   GI Review of Systems      Denies abdominal pain, acid reflux, belching, bloating, chest pain, dysphagia with liquids, dysphagia with solids, heartburn, loss of appetite, nausea, vomiting, vomiting blood, weight loss, and  weight gain.        Denies anal fissure, black tarry stools, change in bowel habit, constipation, diarrhea, diverticulosis, fecal incontinence, heme positive stool, hemorrhoids, irritable bowel syndrome, jaundice, light color stool, liver problems, rectal bleeding, and  rectal pain.   Current Medications (verified): 1)  Lexapro 20 Mg Tabs (Escitalopram Oxalate) .... Take 1 Tablet By Mouth Once A Day 2)  Namenda 10 Mg Tabs (Memantine Hcl) .... One Twice Daily ( Takes One By Mouth Once Daily, Will Go Home and Check Bottle) 3)  Preservision/lutein  Caps (Multiple Vitamins-Minerals) .Marland Kitchen.. 1 Tablet By Mouth Two Times A Day 4)  Fish Oil 1000 Mg Caps (Omega-3 Fatty Acids) .... Take 2 Cap By Mouth Once Daily 5)  Entocort Ec 3 Mg Xr24h-Cap (Budesonide) .... 3 Capsules By Mouth Every Morning 6)  Plavix 75 Mg Tabs (Clopidogrel Bisulfate) .Marland Kitchen.. 1 Once Daily 7)  Cvs Acid Controller Max St 20 Mg Tabs (Famotidine) .... One At Bedtime 8)  Exelon 9.5 Mg/24hr Pt24 (Rivastigmine) .... Apply One Patch Daily  Allergies (verified): No Known Drug Allergies  Past History:  Past Medical History: Reviewed history from  01/14/2010 and no changes required. 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 Right hemicolectomy 1960s Lumbar laminectomy 2006 status post Radiation for prostate cancer, 1999 Status post surgery for empyema, 1934 inguinal hernia repair, 1967, and 1999 OD eye surgery 2009 Rib resection as a child (due to infection in the lung) Cataract Extraction 11/2008-bilateral Stress Myoview 07-31-2009  Family History: Reviewed history from 11/05/2009 and no changes required. father died age 26, MI.  Mother died age 7, liver cancer 3 brothers, one sister possible cardiac disease, COPD, and liver cancer No FH of Colon Cancer:  Social History: Reviewed history from 01/14/2010 and no changes required. Married Occupation: retired Alcohol Use - yes 3 ounces per day beer and liquor-sometimes more Daily Caffeine Use 2 cup per day Illicit Drug Use - no Patient gets regular exercise. Patient is a former smoker. -stopped 1972  Review of Systems       The patient complains of arthritis/joint pain, fatigue, urination - excessive, and voice change.         The pertinent positives and negatives are noted as above and in the HPI. All other ROS were reviewed and were negative.   Vital Signs:  Patient profile:   77 year old male Height:      67 inches Weight:      156.8 pounds BMI:     24.65 Pulse rate:   72 / minute Pulse rhythm:   regular BP sitting:   132 / 62  (right arm) Cuff size:   regular  Vitals Entered By: Harlow Mares CMA Duncan Dull)  (March 16, 2010 11:34 AM)  Physical Exam  General:  Well developed, well nourished, no acute distress. Head:  Normocephalic and atraumatic. Eyes:  PERRLA, no icterus. Mouth:  No deformity or lesions, dentition normal. Lungs:  Clear throughout to auscultation. Heart:  Regular rate and rhythm; no murmurs, rubs,  or bruits. Abdomen:  Soft, nontender and nondistended. No masses, hepatosplenomegaly or hernias noted. Normal bowel sounds. Psych:  Alert and cooperative. Normal mood and affect.  Impression & Recommendations:  Problem # 1:  OTH&UNSPEC NONINFECTIOUS GASTROENTERITIS&COLITIS (ICD-558.9) Lmphocytic colitis. Well-controlled and Entocort 9 mg daily. The patient and his daughter are not interested in tapering Entocort as he has failed prior attempts to taper the medication. We will continue current dosing for 6 months and rediscuss tapering the medication at his return office visit.  Patient Instructions: 1)  Pick up your prescription from your pharmacy.  2)  Please schedule a follow-up appointment in 6 months. 3)  Please continue current medications.  4)  The medication list was reviewed and reconciled.  All changed / newly prescribed medications were explained.  A complete medication list was provided to the patient / caregiver.  Prescriptions: ENTOCORT EC 3 MG XR24H-CAP (BUDESONIDE) 3 capsules by mouth every morning  #90 x 5   Entered by:   Christie Nottingham CMA (AAMA)   Authorized by:   Meryl Dare MD [facility]   Signed by:   Meryl Dare MD [facility] on 03/16/2010   Method used:   Electronically to        [facility]. [address] [phone]* (retail)       [address]       [place], [place]  [address]       Ph: [phone] or [phone]       Fax: [phone]   RxID:   [id number]

## 2010-10-27 NOTE — Assessment & Plan Note (Signed)
Summary: 4 month rov/njr   Vital Signs:  Patient profile:   77 year old male Weight:      160 pounds Temp:     97.4 degrees F oral BP sitting:   130 / 70  (left arm) Cuff size:   regular  Vitals Entered By: Duard Brady LPN (January 13, 2010 11:35 AM) CC: 4 mon rov-doing well Is Patient Diabetic? No   Primary Care Provider:  Beverely Low, MD  CC:  4 mon rov-doing well.  History of Present Illness: 77 year old patient who is seen today for follow-up.  His main complaint is memory loss.  He is on Namenda, but apparently has not tolerated, Aricept in the past due to nausea.  History depression, hypertension, and osteoporosis.  There has been a history of weight loss, which has been stable.  Today, his weight is unchanged at 160 compared to 3 months ago.  He is being followed by GI. he has a history of lymphocytic colitis.  This seems to be stable at this time.  Denies any depression  Preventive Screening-Counseling & Management  Alcohol-Tobacco     Smoking Status: never  Allergies (verified): No Known Drug Allergies  Family History: Reviewed history from 11/05/2009 and no changes required. father died age 59, MI.  Mother died age 65, liver cancer 3 brothers, one sister possible cardiac disease, COPD, and liver cancer No FH of Colon Cancer:  Social History: Reviewed history from 01/01/2009 and no changes required. Married Occupation: retired Alcohol Use - yes 3 0unces per day beer and liquor-sometimes more Daily Caffeine Use 2 cup per day Illicit Drug Use - no Patient gets regular exercise. Patient is a former smoker. -stopped 1972 Smoking Status:  never  Review of Systems  The patient denies anorexia, fever, weight loss, weight gain, vision loss, decreased hearing, hoarseness, chest pain, syncope, dyspnea on exertion, peripheral edema, prolonged cough, headaches, hemoptysis, abdominal pain, melena, hematochezia, severe indigestion/heartburn, hematuria,  incontinence, genital sores, muscle weakness, suspicious skin lesions, transient blindness, difficulty walking, depression, unusual weight change, abnormal bleeding, enlarged lymph nodes, angioedema, breast masses, and testicular masses.    Physical Exam  General:  elderly frail, alert, no distress.  Blood pressure low-normal Head:  Normocephalic and atraumatic without obvious abnormalities. No apparent alopecia or balding. Eyes:  No corneal or conjunctival inflammation noted. EOMI. Perrla. Funduscopic exam benign, without hemorrhages, exudates or papilledema. Vision grossly normal. Mouth:  Oral mucosa and oropharynx without lesions or exudates.  Teeth in good repair. Neck:  No deformities, masses, or tenderness noted. Lungs:  Normal respiratory effort, chest expands symmetrically. Lungs are clear to auscultation, no crackles or wheezes. Heart:  Normal rate and regular rhythm. S1 and S2 normal without gallop, murmur, click, rub or other extra sounds. Abdomen:  Bowel sounds positive,abdomen soft and non-tender without masses, organomegaly or hernias noted. Msk:  No deformity or scoliosis noted of thoracic or lumbar spine.   Neurologic:  alert & oriented X3.  he was unable to name the day of the week.  He was unable to recall 3 objects at 5 minutes later.  Unable to do serial sevens, but was able to spell the word world backwards   Impression & Recommendations:  Problem # 1:  NEURASTHENIA (ICD-300.5)  Problem # 2:  MILD COGNITIVE IMPAIRMENT SO STATED (ICD-331.83)  Problem # 3:  HYPERTENSION (ICD-401.9)  Problem # 4:  DEPRESSION (ICD-311)  His updated medication list for this problem includes:    Lexapro 20 Mg Tabs (Escitalopram oxalate) .Marland KitchenMarland KitchenMarland KitchenMarland Kitchen  Take 1 tablet by mouth once a day  Complete Medication List: 1)  Lexapro 20 Mg Tabs (Escitalopram oxalate) .... Take 1 tablet by mouth once a day 2)  Namenda 10 Mg Tabs (Memantine hcl) .... One twice daily 3)  Preservision/lutein Caps (Multiple  vitamins-minerals) .Marland Kitchen.. 1 tablet by mouth two times a day 4)  Fish Oil 1000 Mg Caps (Omega-3 fatty acids) .... Take 2 cap by mouth once daily 5)  Entocort Ec 3 Mg Xr24h-cap (Budesonide) .... 3 capsules by mouth every morning 6)  Plavix 75 Mg Tabs (Clopidogrel bisulfate) .Marland Kitchen.. 1 once daily 7)  [facility]vs Acid Controller Max St 20 Mg Tabs (Famotidine) .... One at bedtime 8)  Exelon 9.5 Mg/24hr Pt24 (Rivastigmine) .... Apply one patch daily  Other Orders: Prescription Created Electronically [id number])  Patient Instructions: 1)  Please schedule a follow-up appointment in 4 months. 2)  Limit your Sodium (Salt). 3)  It is important that you exercise regularly at least 20 minutes 5 times a week. If you develop chest pain, have severe difficulty breathing, or feel very tired , stop exercising immediately and seek medical attention. Prescriptions: EXELON 9.5 MG/24HR PT24 (RIVASTIGMINE) apply one patch daily  #90 x 3   Entered and Authorized by:   Gordy Savers  MD   Signed by:   Gordy Savers  MD on 01/13/2010   Method used:   Print then Give to Patient   RxID:   [id number] PLAVIX 75 MG TABS (CLOPIDOGREL BISULFATE) 1 once daily  #90 x 4   Entered and Authorized by:   Gordy Savers  MD   Signed by:   Gordy Savers  MD on 01/13/2010   Method used:   Print then Give to Patient   RxID:   [id number] NAMENDA 10 MG TABS (MEMANTINE HCL) one twice daily  #180 x 3   Entered and Authorized by:   Gordy Savers  MD   Signed by:   Gordy Savers  MD on 01/13/2010   Method used:   Print then Give to Patient   RxID:   [id number] LEXAPRO 20 MG TABS (ESCITALOPRAM OXALATE) Take 1 tablet by mouth once a day  #90 x 3   Entered and Authorized by:   Gordy Savers  MD   Signed by:   Gordy Savers  MD on 01/13/2010   Method used:   Print then Give to Patient   RxID:   [id number] EXELON 9.5 MG/24HR PT24 (RIVASTIGMINE) apply one patch daily  #90 x 3    Entered and Authorized by:   Gordy Savers  MD   Signed by:   Gordy Savers  MD on 01/13/2010   Method used:   Electronically to        [facility] [address] [phone]* (retail)       [address]       [place], [place]  [address]       Ph: [phone] or [phone]       Fax: [phone]   RxID:   [id number] PLAVIX 75 MG TABS (CLOPIDOGREL BISULFATE) 1 once daily  #90 x 4   Entered and Authorized by:   Gordy Savers  MD   Signed by:   Gordy Savers  MD on 01/13/2010   Method used:   Electronically to        [facility] [address] [phone]* (retail)  [address]       [place], [place]  [address]       Ph: [phone] or [phone]       Fax: [phone]   RxID:   [id number] NAMENDA 10 MG TABS (MEMANTINE HCL) one twice daily  #180 x 3   Entered and Authorized by:   Gordy Savers  MD   Signed by:   Gordy Savers  MD on 01/13/2010   Method used:   Electronically to        [facility] [address] [phone]* (retail)       [address]       [place], [place]  [address]       Ph: [phone] or [phone]       Fax: [phone]   RxID:   [id number] LEXAPRO 20 MG TABS (ESCITALOPRAM OXALATE) Take 1 tablet by mouth once a day  #90 x 3   Entered and Authorized by:   Gordy Savers  MD   Signed by:   Gordy Savers  MD on 01/13/2010   Method used:   Electronically to        [facility] [address] [phone]* (retail)       [address]       [place], [place]  [address]       Ph: [phone] or [phone]       Fax: [phone]   RxID:   [id number]

## 2010-10-27 NOTE — Progress Notes (Signed)
Summary: refill lexapro  Phone Note Refill Request Message from:  Fax from Pharmacy on June 11, 2010 5:16 PM  Refills Requested: Medication #1:  LEXAPRO 20 MG TABS Take 1 tablet by mouth once a day [facility]   Method Requested: Fax to Local Pharmacy Initial call taken by: Duard Brady LPN,  June 11, 2010 5:16 PM    Prescriptions: LEXAPRO 20 MG TABS (ESCITALOPRAM OXALATE) Take 1 tablet by mouth once a day  #90 x 3   Entered by:   Duard Brady LPN   Authorized by:   Gordy Savers  MD   Signed by:   Duard Brady LPN on 40/98/1191   Method used:   Historical   RxID:   [id number]  faxed to [facility]   kik

## 2010-10-27 NOTE — Assessment & Plan Note (Signed)
Summary: INJURIES FROM A FALL // RS   Vital Signs:  Patient profile:   77 year old male Weight:      160 pounds Temp:     97.6 degrees F oral BP sitting:   110 / 60  (left arm) Cuff size:   regular  Vitals Entered By: Raechel Ache, RN (October 10, 2009 11:22 AM) CC: Larey Seat yesterday on ice, hurt L side face, L hand and L leg   Primary Care Provider:  Beverely Low, MD  CC:  Larey Seat yesterday on ice, hurt L side face, and L hand and L leg.  History of Present Illness: 77 year old patient who slipped and fell on the ice yesterday, sustaining trauma to his left hand and left face and left leg area.  He is seen by dermatology earlier today and is concerned about a laceration involving his left hand at the palmar r surface over the fifth PIP joint.  The fall and laceration occurred greater than 24 hours ago.  He has been applying topical antibiotic ointment.  He has treated hypertension  Allergies: No Known Drug Allergies  Past History:  Past Medical History: Reviewed history from 07/24/2009 and no changes required. Diverticulitis, hx of Diverticulosis, colon Colon cancer, hx of 1960 Prostate cancer, hx of S/P XRT 1999 Depression Hypertension Skin cancer, hx of Nephrolithiasis, hx of Low back pain Retinal occlusion, OS GERD Osteoporosis spinal stenosis with L4 radiculopathy mild cognitive impairment Chronic Ischemic SVD CT 3/06 Lymphocytic colitis chest pain syndrome-admit October 2010  Review of Systems       The patient complains of suspicious skin lesions.  The patient denies anorexia, fever, weight loss, weight gain, vision loss, decreased hearing, hoarseness, chest pain, syncope, dyspnea on exertion, peripheral edema, prolonged cough, headaches, hemoptysis, abdominal pain, melena, hematochezia, severe indigestion/heartburn, hematuria, incontinence, genital sores, muscle weakness, transient blindness, difficulty walking, depression, unusual weight change, abnormal  bleeding, enlarged lymph nodes, angioedema, breast masses, and testicular masses.    Physical Exam  General:  Well-developed,well-nourished,in no acute distress; alert,appropriate and cooperative throughout examination Head:  left infraorbital ecchymoses Eyes:  No corneal or conjunctival inflammation noted. EOMI. Perrla. Funduscopic exam benign, without hemorrhages, exudates or papilledema. Vision grossly normal. Mouth:  Oral mucosa and oropharynx without lesions or exudates.  Teeth in good repair. Msk:  bandages present over the left knee and thigh areas; his most significant wound was macerated laceration at the flexural surface of the left fifth DIP joint region.  He has some mild tenderness over the dorsal aspect of the hand   Impression & Recommendations:  Problem # 1:  LACERATION, HAND, LEFT (ICD-882.0)  local wound care discussed; will place on prophylactic antibiotic therapy  Orders: Prescription Created Electronically [phone])  Problem # 2:  HYPERTENSION (ICD-401.9)  Orders: Prescription Created Electronically [phone])  Complete Medication List: 1)  Lexapro 20 Mg Tabs (Escitalopram oxalate) .... Take 1 tablet by mouth once a day 2)  Namenda 10 Mg Tabs (Memantine hcl) .... One daily 3)  Preservision/lutein Caps (Multiple vitamins-minerals) .Marland Kitchen.. 1 tablet by mouth two times a day 4)  Fish Oil 1000 Mg Caps (Omega-3 fatty acids) .... Take 2 cap by mouth once daily 5)  Lomotil 2.5-0.025 Mg Tabs (Diphenoxylate-atropine) .Marland Kitchen.. 1 q4h as needed for diarrhea 6)  Entocort Ec 3 Mg Xr24h-cap (Budesonide) .... 2 capsules by mouth every morning 7)  Plavix 75 Mg Tabs (Clopidogrel bisulfate) .Marland Kitchen.. 1 once daily 8)  [facility]vs Acid Controller Max St 20 Mg Tabs (Famotidine) .... One at  bedtime 9)  Cephalexin 500 Mg Caps (Cephalexin) .... One twice daily  Patient Instructions: 1)  call if there is any worsening pain, redness, or drainage of pus 2)  keep  left hand elevated as much as possible 3)   call if you develop any fever Prescriptions: CEPHALEXIN 500 MG CAPS (CEPHALEXIN) one twice daily  #10 x 0   Entered and Authorized by:   Gordy Savers  MD   Signed by:   Gordy Savers  MD on 10/10/2009   Method used:   Print then Give to Patient   RxID:   [id number] CEPHALEXIN 500 MG CAPS (CEPHALEXIN) one twice daily  #10 x 0   Entered and Authorized by:   Gordy Savers  MD   Signed by:   Gordy Savers  MD on 10/10/2009   Method used:   Electronically to        [facility] [address] [phone]* (retail)       [address]       [place], [place]  [address]       Ph: [phone] or [phone]       Fax: [phone]   RxID:   [id number]

## 2010-10-27 NOTE — Assessment & Plan Note (Signed)
Summary: EAR IRRIGATION // RS   Vital Signs:  [name] profile:   77 year old male Weight:      155 pounds Temp:     98.2 degrees F oral BP sitting:   132 / 80  (right arm) Cuff size:   regular  Vitals Entered By: Duard Brady LPN (May 15, 2010 4:47 PM) CC: rov - doing ok , needs ears washed. kik Is [name] Diabetic? No   Primary Care Provider:  Thane Edu, MD  CC:  rov - doing ok  and needs ears washed. kik.  History of Present Illness: [name] who is seen today for follow-up.  He presents with a chief complaint diminishing auditory acuity and requests earwax removal.  He does use hearing aids. He has a history of cognitive impairment and is on aggressive therapy. He has treated hypertension. He states that he fell earlier in the year, sustaining trauma to the left shoulder.  He continues to have some occasional pain and stiffness. He has a history of depression, which has been stable. he is accompanied  by a daughter, who states that he is having some cough associated with the ED meals.  The possibility of aspiration discussed.  He states he did have a chest x-ray performed at the [place] last month.  He does have a history of gastroesophageal reflux disease.  Allergies (verified): No Known Drug Allergies  Past History:  Past Medical History: Reviewed history from 01/14/2010 and no changes required. 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 Right hemicolectomy 1960s Lumbar laminectomy 2006 status  post Radiation for prostate cancer, 1999 Status post surgery for empyema, 1934 inguinal hernia repair, 1967, and 1999 OD eye surgery 2009 Rib resection as a child (due to infection in the lung) Cataract Extraction 11/2008-bilateral Stress Myoview 07-31-2009  Review of Systems       The [name] complains of decreased hearing and depression.  The [name] denies anorexia, fever, weight loss, weight gain, vision loss, hoarseness, chest pain, syncope, dyspnea on exertion, peripheral edema, prolonged cough, headaches, hemoptysis, abdominal pain, melena, hematochezia, severe indigestion/heartburn, hematuria, incontinence, genital sores, muscle weakness, suspicious skin lesions, transient blindness, difficulty walking, unusual weight change, abnormal bleeding, enlarged lymph nodes, angioedema, breast masses, and testicular masses.    Physical Exam  General:  Well-developed,well-nourished,in no acute distress; alert,appropriate and cooperative throughout examination Head:  Normocephalic and atraumatic without obvious abnormalities. No apparent alopecia or balding. Eyes:  No corneal or conjunctival inflammation noted. EOMI. Perrla. Funduscopic exam benign, without hemorrhages, exudates or papilledema. Vision grossly normal. Ears:  bilateral cerumen impaction Mouth:  Oral mucosa and oropharynx without lesions or exudates.  Teeth in good repair. Neck:  No deformities, masses, or tenderness noted. Lungs:  bibasilar rales Heart:  Normal rate and regular rhythm. S1 and S2 normal without gallop, murmur, click, rub or other extra sounds. Abdomen:  Bowel sounds positive,abdomen soft and non-tender without masses, organomegaly or hernias noted. Msk:  No deformity or scoliosis noted of thoracic or lumbar spine.   Extremities:  no edema   Impression & Recommendations:  Problem # 1:  MILD COGNITIVE IMPAIRMENT SO STATED (ICD-331.83)  Problem #  2:  CERUMEN IMPACTION, BILATERAL (ICD-380.4) ears irrigated until  clear  Problem # 3:  GERD (ICD-530.81)  His updated medication list for this problem includes:    Cvs Acid Controller Max St 20 Mg Tabs (Famotidine) ..... One at bedtime will consider referral for swallowing study if symptoms fail to improve  Problem # 4:  HYPERTENSION (ICD-401.9)  Complete Medication List: 1)  Lexapro 20 Mg Tabs (Escitalopram oxalate) .... Take 1 tablet by mouth once a day 2)  Namenda 10 Mg Tabs (Memantine hcl) .... One twice daily ( takes one by mouth once daily, will go home and check bottle) 3)  Preservision/lutein Caps (Multiple vitamins-minerals) .Marland Kitchen.. 1 tablet by mouth two times a day 4)  Fish Oil 1000 Mg Caps (Omega-3 fatty acids) .... Take 2 cap by mouth once daily 5)  Entocort Ec 3 Mg Xr24h-cap (Budesonide) .... 3 capsules by mouth every morning 6)  Plavix 75 Mg Tabs (Clopidogrel bisulfate) .Marland Kitchen.. 1 once daily 7)  Cvs Acid Controller Max St 20 Mg Tabs (Famotidine) .... One at bedtime 8)  Exelon 9.5 Mg/24hr Pt24 (Rivastigmine) .... Apply one patch daily 9)  Align Caps (Probiotic product) .... Qd 10)  Diphenoxylate-atropine 2.5-0.025 Mg Tabs (Diphenoxylate-atropine) .... One every 6 hours for diarrhea  [name] Instructions: 1)  Please schedule a follow-up appointment in 3 months. 2)  Limit your Sodium (Salt) to less than 2 grams a day(slightly less than 1/2 a teaspoon) to prevent fluid retention, swelling, or worsening of symptoms. Prescriptions: DIPHENOXYLATE-ATROPINE 2.5-0.025 MG TABS (DIPHENOXYLATE-ATROPINE) one every 6 hours for diarrhea  #40 x 3   Entered and Authorized by:   Gordy Savers  MD   Signed by:   Gordy Savers  MD on 05/15/2010   Method used:   Print then Give to [name]   RxID:   [id number]

## 2010-10-29 NOTE — Progress Notes (Signed)
Summary: Medication  Phone Note Call from Patient Call back at Home Phone [phone]   Caller: Patient Call For: Dr. Russella Dar Reason for Call: Refill Medication Summary of Call: Needs his BUDESONIDE refilled.[facility] [place]Marland KitchenMarland KitchenNext Appointment: 10/29/2010 Initial call taken by: Karna Christmas,  October 12, 2010 1:35 PM  Follow-up for Phone Call        Rx was sent to pts pharmacy but told pt to keep appt for the 6 month f/u before any further refills can be sent into his pharmacy.  Follow-up by: Christie Nottingham CMA Duncan Dull),  October 12, 2010 1:58 PM    Prescriptions: ENTOCORT EC 3 MG XR24H-CAP (BUDESONIDE) 3 capsules by mouth every morning  #90 x 0   Entered by:   Christie Nottingham CMA (AAMA)   Authorized by:   Meryl Dare MD [facility]   Signed by:   Christie Nottingham CMA (AAMA) on 10/12/2010   Method used:   Electronically to        [facility]. [address] [phone]* (retail)       [address]       [place], [place]  [address]       Ph: [phone] or [phone]       Fax: [phone]   RxID:   [id number]

## 2010-11-12 ENCOUNTER — Ambulatory Visit (INDEPENDENT_AMBULATORY_CARE_PROVIDER_SITE_OTHER): Payer: Federal, State, Local not specified - PPO | Admitting: Gastroenterology

## 2010-11-12 ENCOUNTER — Encounter: Payer: Self-pay | Admitting: Gastroenterology

## 2010-11-12 DIAGNOSIS — K5289 Other specified noninfective gastroenteritis and colitis: Secondary | ICD-10-CM

## 2010-11-18 NOTE — Assessment & Plan Note (Signed)
Summary: Med refill-[facility]   History of Present Illness Visit Type: Follow-up Visit Primary GI MD: Elie Goody MD [facility] Primary Provider: Thane Edu, MD Requesting Provider: na Chief Complaint: Lymphocytic colitis, no GI complaints History of Present Illness:   [name] returns today for followup of lymphocytic colitis. He has no one soft bowel movement every morning. We have attempted to decrease Entocort in the past. His diarrhea has returned. He has no gastrointestinal complaints.   GI Review of Systems      Denies abdominal pain, acid reflux, belching, bloating, chest pain, dysphagia with liquids, dysphagia with solids, heartburn, loss of appetite, nausea, vomiting, vomiting blood, weight loss, and  weight gain.        Denies anal fissure, black tarry stools, change in bowel habit, constipation, diarrhea, diverticulosis, fecal incontinence, heme positive stool, hemorrhoids, irritable bowel syndrome, jaundice, light color stool, liver problems, rectal bleeding, and  rectal pain. Current Medications (verified): 1)  Lexapro 20 Mg Tabs (Escitalopram Oxalate) .... Take 1 Tablet By Mouth Once A Day 2)  Namenda 10 Mg Tabs (Memantine Hcl) .... One Twice Daily ( Takes One By Mouth Once Daily, Will Go Home and Check Bottle) 3)  Preservision/lutein  Caps (Multiple Vitamins-Minerals) .Marland Kitchen.. 1 Tablet By Mouth Two Times A Day 4)  Fish Oil 1000 Mg Caps (Omega-3 Fatty Acids) .... Take 2 Cap By Mouth Once Daily 5)  Entocort Ec 3 Mg Xr24h-Cap (Budesonide) .... 3 Capsules By Mouth Every Morning 6)  Plavix 75 Mg Tabs (Clopidogrel Bisulfate) .Marland Kitchen.. 1 Once Daily 7)  Cvs Acid Controller Max St 20 Mg Tabs (Famotidine) .... One At Bedtime 8)  Exelon 9.5 Mg/24hr Pt24 (Rivastigmine) .... Apply One Patch Daily 9)  Align  Caps (Probiotic Product) .... Qd 10)  Diphenoxylate-Atropine 2.5-0.025 Mg Tabs (Diphenoxylate-Atropine) .... 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  Mother died age 37, liver cancer 3 brothers, one sister possible cardiac disease, COPD, and liver cancer No FH of Colon Cancer:  Social History: Last updated: 01/14/2010 Married Occupation: retired Alcohol Use - yes 3 ounces per day beer and liquor-sometimes more Daily Caffeine Use 2 cup per day Illicit Drug Use - no Patient gets regular exercise. Patient is a former smoker. -stopped 1972  Vital Signs:  Patient profile:   77 year old male Height:      67 inches Weight:      157 pounds BMI:     24.68 BSA:     1.83 Pulse rate:   80 / minute Pulse rhythm:   regular BP sitting:   116 / 70  (left arm)  Vitals Entered By: Merri Ray CMA Duncan Dull) (November 12, 2010 9:45 AM)  Physical Exam  General:  Well developed, well nourished, no acute distress. Head:  Normocephalic and atraumatic. Ears:  Normal auditory acuity. Mouth:  No deformity or lesions, dentition normal. Lungs:  Clear throughout to  auscultation. Heart:  Regular rate and rhythm; no murmurs, rubs,  or bruits. Abdomen:  Soft, nontender and nondistended. No masses, hepatosplenomegaly or hernias noted. Normal bowel sounds. Pulses:  Normal pulses noted. Extremities:  No clubbing, cyanosis, edema or deformities noted. Psych:  Alert and cooperative. Normal mood and affect.  Impression & Recommendations:  Problem # 1:  OTH&UNSPEC NONINFECTIOUS GASTROENTERITIS&COLITIS (ICD-558.9) Lmphocytic colitis. Well-controlled on Entocort 9 mg daily. The patient and his daughter are not interested in tapering Entocort as he has failed prior attempts to taper the medication. We will continue current dosing for 12 months and can rediscuss tapering the medication at his return office visit. Diphenoxylate-Atropine 2.5-0.025 Mg Tabs p.r.n.  Patient Instructions: 1)  Your prescription for Entocort has been sent to your pharmacy.  2)  Please schedule a follow-up appointment in 1 year. 3)  The medication list was reviewed and reconciled.  All changed / newly prescribed medications were explained.  A complete medication list was provided to the patient / caregiver.  Prescriptions: ENTOCORT EC 3 MG XR24H-CAP (BUDESONIDE) 3 capsules by mouth every morning  #90 x 11   Entered by:   Christie Nottingham CMA (AAMA)   Authorized by:   Meryl Dare MD [facility]   Signed by:   Meryl Dare MD [facility] on 11/12/2010   Method used:   Electronically to        [facility]. [address] [phone]* (retail)       [address]       [place], [place]  [address]       Ph: [phone] or [phone]       Fax: [phone]   RxID:   [id number]

## 2010-11-30 ENCOUNTER — Encounter: Payer: Self-pay | Admitting: Internal Medicine

## 2010-12-01 ENCOUNTER — Encounter: Payer: Self-pay | Admitting: Internal Medicine

## 2010-12-01 ENCOUNTER — Ambulatory Visit (INDEPENDENT_AMBULATORY_CARE_PROVIDER_SITE_OTHER): Payer: Federal, State, Local not specified - PPO | Admitting: Internal Medicine

## 2010-12-01 DIAGNOSIS — K219 Gastro-esophageal reflux disease without esophagitis: Secondary | ICD-10-CM

## 2010-12-01 DIAGNOSIS — I10 Essential (primary) hypertension: Secondary | ICD-10-CM

## 2010-12-01 DIAGNOSIS — F329 Major depressive disorder, single episode, unspecified: Secondary | ICD-10-CM

## 2010-12-01 NOTE — Progress Notes (Signed)
  Subjective:    Patient ID: [name], male    DOB: Aug 15, 1922, 77 y.o.   MRN: [id number]  HPI   77 year old patient who is seen today for followup. He has a history of depression and has been treated with Lexapro. He seemed to do quite well. He has a history of cognitive dysfunction. He is accompanied by his wife who complains that he has very little interest in daily activities. He does seem to want to sleep a lot. Today he seems alert with a very bright affect often no complaints. He looks clinically quite well.    Review of Systems  Constitutional: Negative for fever, chills, appetite change and fatigue.  HENT: Negative for hearing loss, ear pain, congestion, sore throat, trouble swallowing, neck stiffness, dental problem, voice change and tinnitus.   Eyes: Negative for pain, discharge and visual disturbance.  Respiratory: Negative for cough, chest tightness, wheezing and stridor.   Cardiovascular: Negative for chest pain, palpitations and leg swelling.  Gastrointestinal: Negative for nausea, vomiting, abdominal pain, diarrhea, constipation, blood in stool and abdominal distention.  Genitourinary: Negative for urgency, hematuria, flank pain, discharge, difficulty urinating and genital sores.  Musculoskeletal: Negative for myalgias, back pain, joint swelling, arthralgias and gait problem.  Skin: Negative for rash.  Neurological: Negative for dizziness, syncope, speech difficulty, weakness, numbness and headaches.  Hematological: Negative for adenopathy. Does not bruise/bleed easily.  Psychiatric/Behavioral: Negative for behavioral problems and dysphoric mood. The patient is not nervous/anxious.        Objective:   Physical Exam  Constitutional: He is oriented to person, place, and time. He appears well-developed.  HENT:  Head: Normocephalic.  Right Ear: External ear normal.  Left Ear: External ear normal.  Eyes: Conjunctivae and EOM are normal.  Neck: Normal range of motion.    Cardiovascular: Normal rate and normal heart sounds.   Pulmonary/Chest: Effort normal.        Bibasilar rales  Abdominal: Bowel sounds are normal.  Musculoskeletal: Normal range of motion. He exhibits no edema and no tenderness.  Neurological: He is alert and oriented to person, place, and time.  Psychiatric: He has a normal mood and affect. His behavior is normal.          Assessment & Plan:   depression appears clinically stable. We'll continue with the Lexapro.  Hypertension stable  We'll recheck in 6 months laboratory studies will be checked at that time

## 2010-12-01 NOTE — Patient Instructions (Signed)
Limit your sodium (Salt) intake    It is important that you exercise regularly, at least 20 minutes 3 to 4 times per week.  If you develop chest pain or shortness of breath seek  medical attention.  Return in 3 months for follow-up  

## 2010-12-30 LAB — CBC
HCT: 41.5 % (ref 39.0–52.0)
Hemoglobin: 14.1 g/dL (ref 13.0–17.0)
MCHC: 34.1 g/dL (ref 30.0–36.0)
Platelets: 231 10*3/uL (ref 150–400)

## 2010-12-30 LAB — PROTIME-INR: Prothrombin Time: 12.9 seconds (ref 11.6–15.2)

## 2010-12-30 LAB — URINALYSIS, ROUTINE W REFLEX MICROSCOPIC
Bilirubin Urine: NEGATIVE
Glucose, UA: NEGATIVE mg/dL
Hgb urine dipstick: NEGATIVE
Ketones, ur: NEGATIVE mg/dL
pH: 5.5 (ref 5.0–8.0)

## 2010-12-30 LAB — LUPUS ANTICOAGULANT PANEL
DRVVT: 40.5 secs (ref 34.7–40.5)
Lupus Anticoagulant: NOT DETECTED

## 2010-12-30 LAB — COMPREHENSIVE METABOLIC PANEL
Alkaline Phosphatase: 119 U/L — ABNORMAL HIGH (ref 39–117)
BUN: 16 mg/dL (ref 6–23)
CO2: 31 mEq/L (ref 19–32)
Calcium: 9.7 mg/dL (ref 8.4–10.5)
Chloride: 105 mEq/L (ref 96–112)
Glucose, Bld: 79 mg/dL (ref 70–99)
Potassium: 4.4 mEq/L (ref 3.5–5.1)

## 2010-12-30 LAB — ANTITHROMBIN III: AntiThromb III Func: 108 % (ref 75–120)

## 2010-12-30 LAB — LIPID PANEL
HDL: 78 mg/dL (ref >39)
Triglycerides: 42 mg/dL (ref <150)

## 2010-12-31 LAB — LIPID PANEL
HDL: 72 mg/dL (ref >39)
Total CHOL/HDL Ratio: 2 RATIO

## 2010-12-31 LAB — CBC
MCHC: 34 g/dL (ref 30.0–36.0)
MCV: 102.1 fL — ABNORMAL HIGH (ref 78.0–100.0)
Platelets: 224 10*3/uL (ref 150–400)
WBC: 6.9 10*3/uL (ref 4.0–10.5)

## 2010-12-31 LAB — CK TOTAL AND CKMB (NOT AT ARMC)
CK, MB: 2.2 ng/mL (ref 0.3–4.0)
Relative Index: 2.1 (ref 0.0–2.5)
Total CK: 107 U/L (ref 7–232)

## 2010-12-31 LAB — POCT CARDIAC MARKERS: Myoglobin, poc: 93.8 ng/mL (ref 12–200)

## 2010-12-31 LAB — POCT I-STAT, CHEM 8
BUN: 16 mg/dL (ref 6–23)
Hemoglobin: 13.9 g/dL (ref 13.0–17.0)
Sodium: 141 mEq/L (ref 135–145)
TCO2: 28 mmol/L (ref 0–100)

## 2010-12-31 LAB — TROPONIN I: Troponin I: 0.02 ng/mL (ref 0.00–0.06)

## 2010-12-31 LAB — CARDIAC PANEL(CRET KIN+CKTOT+MB+TROPI)
Relative Index: INVALID (ref 0.0–2.5)
Troponin I: 0.01 ng/mL (ref 0.00–0.06)
Troponin I: 0.03 ng/mL (ref 0.00–0.06)

## 2011-01-21 ENCOUNTER — Other Ambulatory Visit: Payer: Self-pay

## 2011-01-21 MED ORDER — MEMANTINE HCL 10 MG PO TABS: 10.0000 mg | ORAL_TABLET | Freq: Two times a day (BID) | ORAL | Status: DC

## 2011-01-21 NOTE — Telephone Encounter (Signed)
Faxed back to [facility] [address] [facility]

## 2011-02-12 NOTE — Assessment & Plan Note (Signed)
[facility]                            [facility] OFFICE NOTE   KENNON, ENCINAS                       MRN:          [id number]  DATE:10/31/2006                            DOB:          April 24, 1922    An 77 year old gentleman seen today for an annual exam.  He has a long  history of anxiety and depression, though his main concern today is  vivid dreams.  These have been chronic however.  He has a history of  lumbar disk disease status post laminectomy.  There is a remote history  of colon cancer in his 55s.  He has hypertension, osteoporosis, history  of gastroesophageal reflux disease, and stricture formation, and a  remote history of prostate cancer status post radiation treatment.   FAMILY HISTORY:  Reviewed and basically unchanged.   EXAM:  Well-developed, fit, elderly male, no acute distress.  SKIN:  Remarkable for senile changes due to chronic solar exposure.  Blood pressure was low normal.  FUNDI, EARS, NOSE, AND THROAT:  Clear.  Ear aids were in place  bilaterally.  NECK:  No bruits.  CHEST:  A few basilar crackles.  CARDIOVASCULAR:  Normal heart sounds, rare ectopic's.  No murmur.  ABDOMEN:  Benign.  External genitalia revealed some testicular atrophy, otherwise  unremarkable.  RECTAL:  Heme-negative.   IMPRESSION:  1. Chronic gastroesophageal reflux disease.  2. Remote history of colon cancer.  3. Hypertension, history of, presently stable off medications.  4. Depression.   DISPOSITION:  Medical regimen unchanged.  Reassess in 6 months.  Laboratory studies were reviewed.  These were unremarkable.  Electrocardiogram was unremarkable.     Gordy Savers, MD  Electronically Signed    PFK/MedQ  DD: 10/31/2006  DT: 10/31/2006  Job #: [id number]

## 2011-02-12 NOTE — H&P (Signed)
[name], [name] NO.:  [id number]   MEDICAL RECORD NO.:  [id number]          PATIENT TYPE:  EMS   LOCATION:  ED                           FACILITY:  [facility]   PHYSICIAN:  Michaelyn Barter, M.D. DATE OF BIRTH:  28-Oct-1921   DATE OF ADMISSION:  02/28/2005  DATE OF DISCHARGE:                                HISTORY & PHYSICAL   The patient's primary care physician is unassigned.   CHIEF COMPLAINT:  Back pain and left leg pain.   [name] is an 77 year old gentleman with a past medical history of  prostate cancer, status post radiation therapy, who developed lower back  pain approximately 10 days ago over his left lumbosacral region.  The pain  was described as stabbing and burning.  It is constant; however, movement  makes it worse.  The pain has gone as high as 10 on a scale of one to 10.  He also has experienced some pain within the lower anterior surface of his  left leg.  He recently moved to [place] from [place] and has had  difficulty climbing stairs secondary to pain experienced within his left  leg.  He was seen in the ER last weekend in [place] for evaluation of his  back pain, and at that time he was told that he had sciatica.  He was given  quinine and Vicodin.  He was also treated for a urinary tract infection with  Levaquin, and he was scheduled to see Dr. __________ for evaluation  tomorrow; however, his pain has become so bad that he was brought to the  emergency room for further evaluation.  He denies having any back trauma,  but his daughter and wife both state that he lifted some heavy boxes in  preparation for moving from [place] to [place] at around the same  time that his back pain started.  He denies any bladder or urinary  incontinence, no fevers since last weekend; however, he did have a fever  last weekend, at which time he was diagnosed with his urinary tract  infection.   PAST MEDICAL HISTORY:  1.   Diverticulitis.  2.  Extensive colonic diverticulosis.  3.  Prostate cancer, status post radiation therapy.  4.  Colon cancer in the 1960s.  5.  Nephrolithiasis.  6.  Multiple skin cancers, predominantly on his face.  7.  Esophageal narrowing.  8.  Breathing problems which occur at night time.  The patient was going to      be prescribed CPAP.  9.  Retinal occlusion within the left eye.  10. Hearing impairment, for which the patient wears a hearing aid.  11. Problems with the left knee.  12. Bone problems secondary to radiation therapy.  13. Depression.   PAST SURGICAL HISTORY:  1.  Right hemicolectomy secondary to colon cancer in the 1960s.  2.  Inguinal hernia repair.  3.  Two eye surgeries secondary to occlusion.  4.  Multiple skin cancers resected.   ALLERGIES:  No known drug allergies.   HOME MEDICATIONS:  1.  Baby aspirin 81 mg daily.  2.  Quinine sulfate 200 mg q.h.s. p.r.n.  3.  Actonel.  4.  Levaquin 500 mg p.o. daily for 14 days.  5.  Prednisolone acetate ophthalmic suspension USP.  6.  Hydrocodone/APAP 7.5/325 mg one to two tablets p.o. q.4h.  7.  Lexapro 10 mg p.o. daily.  8.  Bromfenac ophthalmic solution 0.09%.   FAMILY HISTORY:  Mother had a history of liver cancer.  Father had a heart  attack in his 68s or 33s.   SOCIAL HISTORY:  Cigarettes:  The patient stopped smoking 25 years ago.  Started smoking at the age of 79.  He smoked up to two packs per day.  Alcohol:  The patient drinks two ounces of gin per day and has done so for  most of his life.   PHYSICAL EXAMINATION:  GENERAL:  The patient is awake.  He is cooperative.  He is in no obvious distress at this particular time; however, the nurse has  given him pain medication and she is about to give him another dose of pain  medication.  VITALS AT THE TIME OF ADMISSION:  Temperature 97.4, blood pressure 201/72,  heart rate 82, respirations 25, O2 saturation 94% on room air.  HEENT:  Anicteric,  extraocular movements are intact, decreased pupil  reactivity to light.  Uvula is pink, midline, no exudates.  NECK:  Supple, no lymphadenopathy.  Thyroid is not palpable.  CARDIAC:  S1, S2 is present; however, there are multiple extra beats also  auscultated.  RESPIRATORY:  No crackles, no wheezes.  ABDOMEN:  Soft, nontender, nondistended.  Positive bowel sounds.  BACK:  Positive left lower lumbar/sacral tenderness to palpation; however,  the pain appears to be blunted secondary to pain medication that has been  given for the pain.  EXTREMITIES:  No leg edema in either leg.  There is a small region of  tenderness on the anterior surface of the left lower extremity.  There is no  obvious source of trauma, no erythema, no skin breakdown.  Likewise, on the  dorsal and plantar surface of the left foot, there is no obvious sign of  trauma, no erythema, no skin breakdown.  No obvious deformity and no  swelling.  NEUROLOGIC:  The patient is oriented to person, date, and knows that he is  in [place].   LABORATORY DATA:  Sodium is 138, potassium 4.7, chloride 107, CO2 29, BUN 9,  creatinine 1.0.  White blood cells 9.5, hemoglobin 15.2, hematocrit 44.6,  platelets 340.  D-dimer is 0.40.  Bilirubin total 1.2.  Alkaline phosphatase  elevated at 152, SGOT 32, SGPT 31, total protein 7.0, albumin 3.9, calcium  9.7.  Urinalysis:  Nitrites negative, leukocytes negative.   ASSESSMENT AND PLAN:  1.  Acute back pain.  This may have occurred secondary to muscle injury,      which may have been precipitated by the heavy lifting that the patient      did in his preparation for leaving [place] to move to [place].      Will provide pain medications for now.  Will order x-rays of the      lumbosacral spine and will consider MRI given the fact that it has been      at least 10 days and the patient still complains of a significant amount      of pain. 2.  Urinary tract infection.  Will resume  the previously-prescribed      antibiotic, Levaquin, for now.  3.  History of diverticulosis.  No current complaints.  Will monitor.  4.  Elevated blood pressure.  The family denies the patient having a prior      history of hypertension despite the fact that his blood pressure was      significantly elevated.  Pain may have contributed to the patient's      blood pressure being elevated at the time of admission.  Will monitor      the patient's blood pressure for now and may consider adding an      antihypertensive in the a.m.  5.  Elevated alkaline phosphatase, the etiology of which is questionable.      The patient's abdomen is completely benign with regard to physical      examination.  Will, however, check an ultrasound of the liver.  6.  Gastrointestinal prophylaxis.  Will provide Protonix 40 mg daily.  7.  Deep vein thrombosis prophylaxis.  Will provide Lovenox 40 mg subcu      q.24h.       OR/MEDQ  D:  02/28/2005  T:  02/28/2005  Job:  [id number]

## 2011-02-12 NOTE — Discharge Summary (Signed)
[name], [name]                ACCOUNT NO.:  [id number]   MEDICAL RECORD NO.:  [id number]          PATIENT TYPE:  INP   LOCATION:  3021                         FACILITY:  [facility]   PHYSICIAN:  Rene Paci, M.D. LHCDATE OF BIRTH:  Dec 19, 1921   DATE OF ADMISSION:  02/28/2005  DATE OF DISCHARGE:  03/03/2005                                 DISCHARGE SUMMARY   DISCHARGE DIAGNOSES:  1.  Moderate spinal stenosis, L3-L4 with acute L4 radiculopathy.  2.  Rule out basal cell carcinoma.  3.  Dysphagia, question stricture versus achalasia.   HISTORY OF PRESENT ILLNESS:  The patient is an 77 year old male with  multiple medical problems who was seen in an emergency department in  [place] prior to admission for lower extremity back pain of approximately  10 days' duration.  The patient was admitted to [facility] for further  evaluation and evaluation of severe and intractable left buttock pain.   PAST MEDICAL HISTORY:  1.  Diverticulitis.  2.  Extensive colonic diverticulosis.  3.  Prostate cancer status post radiation therapy.  4.  Colon cancer in the 1960's.  5.  Nephrolithiasis.  6.  Multiple skin cancers predominantly on his face.  7.  Esophageal narrowing.  8.  Breathing problems at night, ?obstructive sleep apnea.  The patient      apparently was going to be prescribed C-PAP.  9.  Retinal occlusion within left eye.  10. Hearing impairment.  The patient wears hearing aids.  11. Problems with left knee.  12. Depression.  13. Bone problems secondary to radiation therapy.  14. Hypertension.   HOSPITAL COURSE:  1.  The patient was admitted for further evaluation of low back pain,      problem #1.  An MRI was performed of the lumbrosacral spine.  T12 to L1      paracentral disk protrusion was noted, moderate spinal stenosis of L3-      L4, disk protrusion at L4 left nerve root, L4-L5 disk protrusion with      early spinal/lateral recess stenosis.  The patient was placed on    steroids and did not show any improvement.  As a result, the      neurosurgical team was consulted.  Neurosurgery suggested a steroid      taper as an outpatient along with pain management and outpatient      epidural steroid injection which has been arranged for June 21.  In      addition, the patient will be discharged to home with home health PT for      further rehabilitation.  2.  Rule out basal cell cancer.  The patient noted right inner thigh lesion      which is pink and raised, approximately 0.5 cm in diameter.  The patient      will need outpatient referral to dermatology.  3.  History of prostate cancer.  The patient is status post radiation      therapy.  A PSA was ordered which came back as 1.35 which is within      normal limits.  4.  History of colon diverticulosis.  The patient had no GI complaints at      this time.  5.  History of dysphagia.  Question stricture versus achalasia.  No specific      complaints at this time.  However, the patient may need further workup      and possible EGD as an outpatient.  6.  The patient's blood pressure was noted to be slightly elevated secondary      to pain.  At time of discharge, blood pressure is 123/67.   DISCHARGE MEDICATIONS:  1.  Protonix 40 mg p.o. daily.  2.  Aspirin.  The patient is instructed to continue to hold aspirin      secondary to upcoming epidural steroid injection.  3.  Avelox 400 mg daily x5 days for urinary tract infection.  4.  Lexapro 10 mg daily.  5.  Pred-Forte 1% ophthalmic drops to both eyes once daily.  6.  Prednisone taper 60 mg p.o. on June 8, 50 mg p.o. on June 9, 40 mg p.o.      on June 10, 30 mg p.o. on June 11, 20 mg p.o. on  June 12, 10 mg p.o. on      June 13, then discontinue.  7.  Norvasc 5 mg p.o. daily.  8.  Percocet 1-2 tabs every 6 hours as needed for pain.   LABORATORIES AT DISCHARGE:  Hemoglobin 14.5, hematocrit 43.2, BUN 12,  creatinine 0.9, glucose 112.  PSA 1.35.   FOLLOW UP:   1.  The patient is to follow up with Dr. Amador Cunas for a new patient visit      on March 10, 2005, at 8:45 a.m.  2.  The patient is to follow up with Dr. Marikay Alar on March 09, 2005.  The      patient is to call for appointment.  3.  The patient is to follow up at [facility] on March 17, 2005, at      9:15 a.m. for epidural steroid injection.       MSO/MEDQ  D:  03/03/2005  T:  03/03/2005  Job:  [id number]   cc:   Gordy Savers, M.D. [facility]   Tia Alert, MD  Fax: [phone]

## 2011-02-12 NOTE — Op Note (Signed)
[name], [name] NO.:  [id number]   MEDICAL RECORD NO.:  [id number]          PATIENT TYPE:  INP   LOCATION:  3172                         FACILITY:  [facility]   PHYSICIAN:  Tia Alert, MD     DATE OF BIRTH:  April 14, 1922   DATE OF PROCEDURE:  03/17/2005  DATE OF DISCHARGE:                                 OPERATIVE REPORT   PREOPERATIVE DIAGNOSIS:  Lumbar spinal stenosis L3-4, with far lateral disk  herniation L4-5 on the left.   POSTOPERATIVE DIAGNOSIS:  Lumbar spinal stenosis L3-4, with far lateral disk  herniation L4-5 on the left.   PROCEDURES:  1.  Extraforaminal microdiskectomy L4-5 on the left, utilizing microscopic      dissection.  2.  Decompressive lumbar hemilaminectomy, medial facetectomy, foraminotomy      L3-4 on the left for spinal stenosis.  3.  Microdissection.   SURGEON:  Tia Alert, MD   ASSISTANT:  [name], M.D.   ANESTHESIA:  General endotracheal.   COMPLICATIONS:  None apparent.   INDICATIONS FOR PROCEDURE:  [name] is an 77 year old white male who was  admitted to the hospital about three weeks ago with severe back and leg  pain. He had an MRI which showed spinal stenosis at L3-4, with a  large free  fragment at L4-5 far extraforaminally on the left. He tried medical  management without significant relief. I recommended a decompressive lumbar  hemilaminectomy for the spinal stenosis at L3-4, followed by far lateral  diskectomy L4-5 on the left side. He understood the risks, benefits and  expected outcome and wished to proceed.   DESCRIPTION OF PROCEDURE:  The patient was taken to operating room, and  after induction of adequate generalized endotracheal anesthesia he was  rolled into the prone position on the Wilson frame. All pressure points were  padded. His lumbar region was prepped with the DuraPrep and then draped in  the usual sterile fashion.  Local anesthesia of 10 cc was injected and then  a dorsal midline  incision was made and carried down to the lumbosacral  fascia. The fascia was opened on the left side and the paraspinous  musculature was taken down in a subperiosteal fashion to expose the L3-4 and  L4-5 interspaces.  Intraoperative x-ray confirmed my level and then  hemilaminectomy, medial facetectomy and foraminotomy was performed at L3-4  on the left side. The yellow ligament was removed, after exposing the  underlying dura and L4 nerve root. Once this decompression was complete, I  used the drill to drill the pars and superior facet at  L4-5 on the left  side extraforaminally.  I went down to the yellow ligament and brought in  the operating microscope, opened the yellow ligament and identified a very  tented L4 nerve root extraforaminally.  I pulled this medially and found a  very large free fragment, which was removed piecemeal with a pituitary  rongeur and a nerve hook.  I then opened the disk space and performed a  thorough intradiskal diskectomy.  Once the diskectomy was complete, I  palpated  with a nerve hook around the nerve and into the foramen, to assure  adequate decompression of the L4 nerve root.  I then inspected the  decompression medially once again. I then lined the dura with Gelfoam, dried  all bleeding points with bipolar cautery, closed the fascia with interrupted  #1 Vicryl, closed the subcutaneous and subcuticular tissues with 2-0 and 3-0  Vicryl, and  closed the skin with Dermabond. The drapes were removed. The patient was  awakened from general anesthesia and transferred to the recovery room in  stable condition.  At the end of the procedure all sponge, needle and  instruments counts were correct.       DSJ/MEDQ  D:  03/17/2005  T:  03/17/2005  Job:  [id number]

## 2011-02-12 NOTE — Consult Note (Signed)
[name], [name] NO.:  [id number]   MEDICAL RECORD NO.:  [id number]          PATIENT TYPE:  INP   LOCATION:  3021                         FACILITY:  [facility]   PHYSICIAN:  Tia Alert, MD     DATE OF BIRTH:  1922/03/11   DATE OF CONSULTATION:  03/02/2005  DATE OF DISCHARGE:                                   CONSULTATION   CHIEF COMPLAINT:  Left leg pain.   HISTORY OF PRESENT ILLNESS:  [name] is an 77 year old white male with  multiple medical problems who was admitted to [facility] for  further evaluation and management of severe and intractable left buttock and  lower extremity pain of 10 days duration.  The patient denies any  significant low back pain, or any numbness, tingling, or weakness of the  legs.  He denies any bowel or bladder changes.  He does have a history of  prostate cancer and colon cancer.  He was seen in the emergency department  in [place] where he just moved from because of the severe pain, and again  he was re-evaluated and admitted two days ago for continued pain.  MRI of  the lumbar spine showed lumbar spinal stenosis at L3-4 with a far lateral  disk herniation at L4-5, both with compression of the L4 nerve root, and  neurosurgical consultation was requested.  The patient had been started on  oral prednisone and IV morphine and oral Robaxin with some relief of his  symptoms.   PAST MEDICAL HISTORY:  1.  Diverticulitis.  2.  Colonic diverticulosis.  3.  Prostate cancer, status post radiation therapy.  4.  Colon cancer in 1960s.  5.  Nephrolithiasis.  6.  Multiple skin cancers.  7.  Esophageal narrowing.  8.  He is on CPAP for breathing problems at night.  9.  Retinal occlusion within the left eye.  10. Hearing impairment for which he wears hearing aids.  11. Left knee problems.  12. Depression.   MEDICATIONS ON ADMISSION:  1.  Baby aspirin 81 mg daily.  2.  Quinine sulfate.  3.  Actonel.  4.  Levaquin.  5.   Prednisone.  6.  Acetate ophthalmic solution.  7.  Hydrocodone.  8.  Lexapro.   FAMILY HISTORY:  Positive for cancer and heart attack.   SOCIAL HISTORY:  He stopped smoking 25 years ago.  Alcohol:  He usually  takes in about 2 ounces of gin per day for most of his life.   PHYSICAL EXAMINATION:  VITAL SIGNS:  He is afebrile.  Vital signs are  stable.  Heart rate is 82, respirations are 18.  GENERAL:  A pleasant, cooperative white male who is in some obvious  discomfort with movement.  HEENT:  Normocephalic, atraumatic.  Extraocular movements were intact.  NECK:  Supple.  HEART:  Regular rate and rhythm.  ABDOMEN:  Soft.  EXTREMITIES:  No lower extremity edema.  NEUROLOGIC:  He is awake and alert.  He is oriented x4.  No aphasia.  Good  attention span, fund of knowledge, and memory appear  to be appropriate.  No  facial asymmetry.  Tongue protrudes at midline.  He can puff out his cheeks.  His strength in his lower extremities is 5/5, with good muscle tone and good  muscle bulk.  Reflexes are a little hypoactive.  He has no pain with passive  range of motion of the left hip.  He has a negative straight leg raise test.  He walks with a slight limp on his left side, but has good strength for  ambulation.  He is voiding well.   LABORATORY DATA:  MRI of the lumbar spine I have reviewed as well as report.  It shows moderate spinal stenosis at L3-4.  He has facet arthropathy at this  level and some ligamentum flavum overgrowth and a broad-based disk bulge.  At L4-5, he has a far lateral disk herniation with a free fragment  compressing the left L4 nerve root.   ASSESSMENT AND PLAN:  I believe he has an acute left L4 radiculopathy.  Most  of the pain is located in the buttocks and then in the shin area.  It seems  to skip the anterior thigh.  He has spinal stenosis at L3-4 and a far  lateral disk herniation at L4-5 on the left side which I think explains the  left L4 radiculopathy quite  well.  I think he is a good candidate for a left  L4 nerve root block or an epidural steroid injection, but his use of aspirin  each day prohibits this.  We will hold his aspirin now in the hopes of being  able to obtain an injection in the next four to seven days.  We will put him  on IV Decadron and hold his oral prednisone for now.  Put him on oral  Percocet along with his Robaxin, and use the morphine for break-through  pain.  I agree with using physical therapy and occupational therapy as  ordered by his primary care team.  He can be discharged from my standpoint  when his pain is under control with oral pain medications, and he can be  discharged with an oral prednisone taper with followup with me one week  after discharge.  He will likely end up needing surgical decompression of L3-  4 with a far lateral diskectomy at L4-5 for decompression of the L4 nerve  root, but I would like to give him a little more time with medical  management and potential injection therapy to see if we can get him by  without surgical therapy.  Thank you for this consult.       DSJ/MEDQ  D:  03/02/2005  T:  03/02/2005  Job:  [id number]

## 2011-03-03 ENCOUNTER — Ambulatory Visit (INDEPENDENT_AMBULATORY_CARE_PROVIDER_SITE_OTHER): Payer: Federal, State, Local not specified - PPO | Admitting: Internal Medicine

## 2011-03-03 ENCOUNTER — Encounter: Payer: Self-pay | Admitting: Internal Medicine

## 2011-03-03 DIAGNOSIS — K219 Gastro-esophageal reflux disease without esophagitis: Secondary | ICD-10-CM

## 2011-03-03 DIAGNOSIS — I10 Essential (primary) hypertension: Secondary | ICD-10-CM

## 2011-03-03 DIAGNOSIS — F488 Other specified nonpsychotic mental disorders: Secondary | ICD-10-CM

## 2011-03-03 DIAGNOSIS — F329 Major depressive disorder, single episode, unspecified: Secondary | ICD-10-CM

## 2011-03-03 NOTE — Progress Notes (Signed)
  Subjective:    Patient ID: [name], male    DOB: 07/10/1922, 77 y.o.   MRN: [id number]  HPI  Wt Readings from Last 3 Encounters:  03/03/11 158 lb (71.668 kg)  12/01/10 157 lb (71.215 kg)  11/12/10 157 lb (71.215 kg)    Review of Systems     Objective:   Physical Exam        Assessment & Plan:

## 2011-03-03 NOTE — Progress Notes (Signed)
  Subjective:    Patient ID: [name], male    DOB: [birth year], 77 y.o.   MRN: [id number]  HPI 77 year old patient who is in today for follow up. He has a history of cognitive impairment which has been stable. He remains on Namenda as well as Exelon which he continues to tolerate well. He has a history of depression and nervous tenia which have been stable he is fairly inactive but has no exercise limitations. Denies any exertional dyspnea. He has a remote history of colon cancer prostate cancer and skin cancer. He has treated hypertension which has been controlled more recently off medication. He remains on Lexapro for his depression. His weight has been stable. His GI status has been unchanged. He does have some mild swallowing difficulties and Some coughing with meals   Review of Systems  Constitutional: Positive for fatigue. Negative for fever, chills and appetite change.  HENT: Negative for hearing loss, ear pain, congestion, sore throat, trouble swallowing, neck stiffness, dental problem, voice change and tinnitus.   Eyes: Negative for pain, discharge and visual disturbance.  Respiratory: Negative for cough, chest tightness, wheezing and stridor.   Cardiovascular: Negative for chest pain, palpitations and leg swelling.  Gastrointestinal: Negative for nausea, vomiting, abdominal pain, diarrhea, constipation, blood in stool and abdominal distention.  Genitourinary: Negative for urgency, hematuria, flank pain, discharge, difficulty urinating and genital sores.  Musculoskeletal: Negative for myalgias, back pain, joint swelling, arthralgias and gait problem.  Skin: Negative for rash.  Neurological: Negative for dizziness, syncope, speech difficulty, weakness, numbness and headaches.  Hematological: Negative for adenopathy. Does not bruise/bleed easily.  Psychiatric/Behavioral: Negative for behavioral problems and dysphoric mood. The patient is not nervous/anxious.        Objective:   Physical Exam  Constitutional: He is oriented to person, place, and time. He appears well-developed.  HENT:  Head: Normocephalic.  Right Ear: External ear normal.  Left Ear: External ear normal.  Eyes: Conjunctivae and EOM are normal.  Neck: Normal range of motion.  Cardiovascular: Normal rate and normal heart sounds.   Pulmonary/Chest: Effort normal. He has rales.       A few bibasilar rales  Abdominal: Bowel sounds are normal.  Musculoskeletal: Normal range of motion. He exhibits no edema and no tenderness.  Neurological: He is alert and oriented to person, place, and time.  Psychiatric: He has a normal mood and affect. His behavior is normal.          Assessment & Plan:  Depression. Appears fairly stable Weight loss. Presently stable Hypertension remains controlled off medication Mild cognitive impairment. No change we'll continue present regimen

## 2011-03-03 NOTE — Patient Instructions (Signed)
It is important that you exercise regularly, at least 20 minutes 3 to 4 times per week.  If you develop chest pain or shortness of breath seek  medical attention.  Avoids foods high in acid such as tomatoes citrus juices, and spicy foods.  Avoid eating within two hours of lying down or before exercising.  Do not overheat.  Try smaller more frequent meals.  If symptoms persist, elevate the head of her bed 12 inches while sleeping.  Limit your sodium (Salt) intake  Return in 6 months for follow-up

## 2011-03-20 ENCOUNTER — Emergency Department (HOSPITAL_COMMUNITY): Payer: Federal, State, Local not specified - PPO

## 2011-03-20 ENCOUNTER — Observation Stay (HOSPITAL_COMMUNITY)
Admission: EM | Admit: 2011-03-20 | Discharge: 2011-03-21 | Disposition: A | Discharge status: Home / Self Care | Payer: Federal, State, Local not specified - PPO | Attending: Internal Medicine | Admitting: Internal Medicine

## 2011-03-20 DIAGNOSIS — M810 Age-related osteoporosis without current pathological fracture: Secondary | ICD-10-CM | POA: Insufficient documentation

## 2011-03-20 DIAGNOSIS — G20 Parkinson's disease: Secondary | ICD-10-CM | POA: Insufficient documentation

## 2011-03-20 DIAGNOSIS — I10 Essential (primary) hypertension: Secondary | ICD-10-CM | POA: Insufficient documentation

## 2011-03-20 DIAGNOSIS — Z8673 Personal history of transient ischemic attack (TIA), and cerebral infarction without residual deficits: Secondary | ICD-10-CM | POA: Insufficient documentation

## 2011-03-20 DIAGNOSIS — I951 Orthostatic hypotension: Principal | ICD-10-CM | POA: Insufficient documentation

## 2011-03-20 DIAGNOSIS — Z9181 History of falling: Secondary | ICD-10-CM | POA: Insufficient documentation

## 2011-03-20 DIAGNOSIS — R269 Unspecified abnormalities of gait and mobility: Secondary | ICD-10-CM | POA: Insufficient documentation

## 2011-03-20 DIAGNOSIS — Z85828 Personal history of other malignant neoplasm of skin: Secondary | ICD-10-CM | POA: Insufficient documentation

## 2011-03-20 DIAGNOSIS — G20A1 Parkinson's disease without dyskinesia, without mention of fluctuations: Secondary | ICD-10-CM | POA: Insufficient documentation

## 2011-03-20 DIAGNOSIS — Z85038 Personal history of other malignant neoplasm of large intestine: Secondary | ICD-10-CM | POA: Insufficient documentation

## 2011-03-20 DIAGNOSIS — F0390 Unspecified dementia without behavioral disturbance: Secondary | ICD-10-CM | POA: Insufficient documentation

## 2011-03-20 LAB — DIFFERENTIAL
Basophils Absolute: 0 10*3/uL (ref 0.0–0.1)
Basophils Relative: 0 % (ref 0–1)
Eosinophils Absolute: 0.1 10*3/uL (ref 0.0–0.7)
Eosinophils Relative: 1 % (ref 0–5)
Monocytes Absolute: 1.1 10*3/uL — ABNORMAL HIGH (ref 0.1–1.0)

## 2011-03-20 LAB — CBC
MCHC: 34.1 g/dL (ref 30.0–36.0)
Platelets: 221 10*3/uL (ref 150–400)
RDW: 13.2 % (ref 11.5–15.5)
WBC: 9.3 10*3/uL (ref 4.0–10.5)

## 2011-03-20 LAB — APTT: aPTT: 31 seconds (ref 24–37)

## 2011-03-20 LAB — PROTIME-INR: INR: 0.96 (ref 0.00–1.49)

## 2011-03-21 DIAGNOSIS — I519 Heart disease, unspecified: Secondary | ICD-10-CM

## 2011-03-21 LAB — BASIC METABOLIC PANEL
BUN: 17 mg/dL (ref 6–23)
CO2: 27 mEq/L (ref 19–32)
Chloride: 101 mEq/L (ref 96–112)
Creatinine, Ser: 0.95 mg/dL (ref 0.50–1.35)
GFR calc Af Amer: >60 mL/min (ref >60)
Potassium: 4.3 mEq/L (ref 3.5–5.1)

## 2011-03-21 LAB — URINALYSIS, ROUTINE W REFLEX MICROSCOPIC
Bilirubin Urine: NEGATIVE
Glucose, UA: NEGATIVE mg/dL
Ketones, ur: NEGATIVE mg/dL
Leukocytes, UA: NEGATIVE
Nitrite: NEGATIVE
Specific Gravity, Urine: 1.011 (ref 1.005–1.030)
pH: 6.5 (ref 5.0–8.0)

## 2011-03-21 LAB — CARDIAC PANEL(CRET KIN+CKTOT+MB+TROPI)
CK, MB: 3 ng/mL (ref 0.3–4.0)
Relative Index: 2.2 (ref 0.0–2.5)
Troponin I: <0.3 ng/mL (ref <0.30)

## 2011-03-21 LAB — CK TOTAL AND CKMB (NOT AT ARMC): Relative Index: 2.1 (ref 0.0–2.5)

## 2011-03-23 ENCOUNTER — Encounter: Payer: Self-pay | Admitting: Internal Medicine

## 2011-03-23 ENCOUNTER — Ambulatory Visit (INDEPENDENT_AMBULATORY_CARE_PROVIDER_SITE_OTHER): Payer: Federal, State, Local not specified - PPO | Admitting: Internal Medicine

## 2011-03-23 DIAGNOSIS — T07XXXA Unspecified multiple injuries, initial encounter: Secondary | ICD-10-CM

## 2011-03-23 DIAGNOSIS — F3289 Other specified depressive episodes: Secondary | ICD-10-CM

## 2011-03-23 DIAGNOSIS — F488 Other specified nonpsychotic mental disorders: Secondary | ICD-10-CM

## 2011-03-23 DIAGNOSIS — F329 Major depressive disorder, single episode, unspecified: Secondary | ICD-10-CM

## 2011-03-23 DIAGNOSIS — IMO0002 Reserved for concepts with insufficient information to code with codable children: Secondary | ICD-10-CM

## 2011-03-23 DIAGNOSIS — G20 Parkinson's disease: Secondary | ICD-10-CM

## 2011-03-23 DIAGNOSIS — I10 Essential (primary) hypertension: Secondary | ICD-10-CM

## 2011-03-23 DIAGNOSIS — G20A1 Parkinson's disease without dyskinesia, without mention of fluctuations: Secondary | ICD-10-CM

## 2011-03-23 NOTE — Progress Notes (Signed)
  Subjective:    Patient ID: [name], male    DOB: 1921/10/20, 77 y.o.   MRN: [id number]  HPI  77 year old patient who is seen in post hospital discharge. He was admitted briefly on the weekend after a fall resulting in multiple soft tissue trauma and skin tears involving his left arm and left knee. Namenda therapy was discontinued. It was unclear whether the fall resulted from  orthostatic hypotension or the patient simply tripped. The patient has Parkinsonian symptoms including unsteady gait decreased arm swing psychomotor retardation with bradykinesia. His family relates intermittent tremor of the hands. Today he has some mild orthostatic hypotension not aggravated by medications   Review of Systems  Constitutional: Positive for fatigue. Negative for fever, chills and appetite change.  HENT: Negative for hearing loss, ear pain, congestion, sore throat, trouble swallowing, neck stiffness, dental problem, voice change and tinnitus.   Eyes: Negative for pain, discharge and visual disturbance.  Respiratory: Negative for cough, chest tightness, wheezing and stridor.   Cardiovascular: Negative for chest pain, palpitations and leg swelling.  Gastrointestinal: Negative for nausea, vomiting, abdominal pain, diarrhea, constipation, blood in stool and abdominal distention.  Genitourinary: Negative for urgency, hematuria, flank pain, discharge, difficulty urinating and genital sores.  Musculoskeletal: Negative for myalgias, back pain, joint swelling, arthralgias and gait problem.  Skin: Positive for wound. Negative for rash.  Neurological: Positive for weakness and light-headedness. Negative for dizziness, syncope, speech difficulty, numbness and headaches.  Hematological: Negative for adenopathy. Does not bruise/bleed easily.  Psychiatric/Behavioral: Positive for confusion. Negative for behavioral problems and dysphoric mood. The patient is not nervous/anxious.        Objective:   Physical Exam    Constitutional: He is oriented to person, place, and time. He appears well-developed.  HENT:  Head: Normocephalic.  Right Ear: External ear normal.  Left Ear: External ear normal.  Eyes: Conjunctivae and EOM are normal.  Neck: Normal range of motion.  Cardiovascular: Normal rate and normal heart sounds.   Pulmonary/Chest: Breath sounds normal.  Abdominal: Bowel sounds are normal.  Musculoskeletal: Normal range of motion. He exhibits no edema and no tenderness.  Neurological: He is alert and oriented to person, place, and time.       Parkinsonian features as described in the history of present illness  Skin:       Extensive skin tears noted above the left arm and left knee heavy dressings were removed areas are cleaned and abraded and rewrapped  Psychiatric: He has a normal mood and affect. His behavior is normal.          Assessment & Plan:   Parkinsonian syndrome Orthostatic hypotension mild Multiple soft tissue trauma with debridement of skin tears.

## 2011-03-28 NOTE — Discharge Summary (Signed)
NAMEBARNELL, [name]                ACCOUNT NO.:  [id number]  MEDICAL RECORD NO.:  [id number]  LOCATION:                                 FACILITY:  PHYSICIAN:  Hartley Barefoot, MD    DATE OF BIRTH:  1922-07-10  DATE OF ADMISSION:  03/21/2011 DATE OF DISCHARGE:                        DISCHARGE SUMMARY - REFERRING   DISCHARGE DIAGNOSES: 1. Orthostatic hypotension probably secondary to autonomic     dysfunction, history of Parkinson disease. 2. Frequent fall probably, secondary to orthostatic hypotension. 3. Dementia. 4. History of stroke. 5. History of hypertension. 6. History of depression. 7. History of Parkinson disease. 8. History of dementia. 9. History of skin cancer. 10.History of diverticulosis.  DISCHARGE MEDICATIONS: 1. Cholestyramine  4 g 1 packet by mouth twice daily. 2. Budesonide 3 mg 2 capsules by mouth daily. 3. Fish oil 1000 mg one tablet by mouth twice daily. 4. Lexapro 10 mg one tablet by mouth daily. 5. Plavix 75 mg p.o. daily. 6. Vitamin 2 tablet by mouth daily. 7. Prilosec 20 mg p.o. daily.  MEDICATIONS STOPPED:  During this hospitalization, [name].  CONSULTANTS:  None.  STUDIES PERFORMED: 1. CT of spine showed a degenerative facet disease.  No acute cervical     spine abnormalities. 2. Atrophy with minimal small vessel chronic ischemic changes on CT     head.  No acute intracranial abnormality. 3. Wrist, no acute osseous abnormality. 4. Knee x-ray, mild degenerative changes osseous demineralization. 5. Elbow x-ray, no definitive acute osseous abnormality. 6. Pelvic x-ray, no acute of this abnormality. 7. Chest x-ray, enlargement of cardiac silhouette, emphysematous and     bronchitic changes.  No acute abnormality. 8. 2-D echo pending at this time.  This will need to be followed by     his primary care physician.  BRIEF HISTORY OF PRESENT ILLNESS:  This a very pleasant 77 year old with history of dementia who lives with wife and daughter  found on the floor on his porch by family.  [name] said that he was walking on the porch, he was alone, he tripped and fell over a table and slowly went down and landed on his left side and he sounds like he specifically remembers exactly what happened, however, his history is somewhat unreliable because of his history of dementia.  He stated that he did not pass out.  He can describe exactly what happened.  He had multiple tears on his left upper arm and left knee, but he was lucid.  He told the family that he had fallen. He has not had any loss of bowel  or bowel or bladder.  Wife relates that he has been having this frequent fall over the last 6 months.  No fevers, chills, chest pain, or shortness of breath.  HOSPITAL COURSE: 1. Frequent fall, questionable syncope event.  The patient was     admitted to telemetry.  He has some sinus bradycardia, but he was     asymptomatic from the bradycardia.  He was found to have     orthostatic hypotension and his systolic blood pressure lying goes     from 175 to a standing to 129.  He received IV fluid  in the     emergency department 1 liter.  I would avoid much more fluid at     this time to avoid overload.  He will need to increase oral intake     and may be increase salt intake.  Although with moderation to avoid     overload.  His first two set of cardiac enzymes negative.  A CT     head was negative.  The patient will have a physical therapy     evaluation will follow the recommendations.  As per family, they     want to take him home as soon as possible.  They will provide     assistance and help at home.  They would like for him to have the     physical therapy at home.  We will try to arrange these also.  The     patient will need to follow with primary care physician within 2 or     3 days to repeat orthostatic vitals.  I will avoid at this time     fludrocortisone because of his elevation of systolic blood     pressure.  He might  need a cortisol level in the morning.  Also I     will stop [name] that can cause bradycardia and sometimes can     cause hypotension.  This will need to be reevaluated by his primary     care physician.  A 2-D echo was ordered, the result is going to be     pending.  I doubt that he had mitral valve prolapse, but if this is     the case, he will need to be referred to a cardiologist.  The     patient's family understand the risk of fall and they really want     to take him home.  They said that they will provide 24 hour care. 2. On the day of discharge, the patient was in a stable condition, and     he denies dizziness or lightheadedness.  Blood pressure 160/75, sat     92 on room air, respirations 18, pulse 66, and temperature 97.9. 3. Hypertension.  The patient's blood pressure increased.  On     admission his blood pressure was 138, but the next morning his     blood pressure increased to the 160.  I will avoid starting any     blood pressure medication at this time due to orthostatic     hypotension to why avoid increase risk for fall.  His blood pressure will     need to be repeated by his primary care physician. 4. On the day of discharge, the patient was in improved condition  LABORATORY DATA:  Troponin 0.30.  UA negative.  Sodium 136, potassium 4.3, chloride 101, bicarb 27, glucose 108, BUN 17, creatinine 0.95. White blood cell 9.3 and hemoglobin 12.8.     Hartley Barefoot, MD     BR/MEDQ  D:  03/21/2011  T:  03/21/2011  Job:  [id number]  Electronically Signed by Hartley Barefoot MD on 03/28/2011 08:25:21 AM

## 2011-03-30 ENCOUNTER — Telehealth: Payer: Self-pay

## 2011-03-30 NOTE — Telephone Encounter (Signed)
[name] with [facility] calling to inform of BP readings today - 126/52 sitting  84/40standing. No c/o dizzy, sob . Just calling to inform.KIK

## 2011-04-06 ENCOUNTER — Encounter: Payer: Self-pay | Admitting: Internal Medicine

## 2011-04-06 ENCOUNTER — Ambulatory Visit (INDEPENDENT_AMBULATORY_CARE_PROVIDER_SITE_OTHER): Payer: Federal, State, Local not specified - PPO | Admitting: Internal Medicine

## 2011-04-06 DIAGNOSIS — G20 Parkinson's disease: Secondary | ICD-10-CM

## 2011-04-06 DIAGNOSIS — F488 Other specified nonpsychotic mental disorders: Secondary | ICD-10-CM

## 2011-04-06 MED ORDER — MEMANTINE HCL 10 MG PO TABS: 10.0000 mg | ORAL_TABLET | Freq: Two times a day (BID) | ORAL | Status: DC

## 2011-04-06 MED ORDER — DIPHENOXYLATE-ATROPINE 2.5-0.025 MG PO TABS: 1.0000 | ORAL_TABLET | Freq: Four times a day (QID) | ORAL | Status: DC | PRN

## 2011-04-06 NOTE — Patient Instructions (Signed)
Liberalize fluid and salt intake Consider support stockings  Resume Namenda   Return in 3 months for follow-up

## 2011-04-06 NOTE — Progress Notes (Signed)
  Subjective:    Patient ID: [name], male    DOB: [birth year], 77 y.o.   MRN: [id number]  HPI  77 year old patient who is seen today for followup. He has a history of mild cognitive impairment as well as parkinsonism. He's had some difficulties with some orthostatic hypotension. He remains asymptomatic without dizziness or near syncope. He fell out recently and his extensive skin tears involve his left arm are largely resolved. He continues to have occasional diarrhea. He has a history of prostate cancer and radiation proctitis    Review of Systems  Constitutional: Positive for fatigue. Negative for fever, chills and appetite change.  HENT: Negative for hearing loss, ear pain, congestion, sore throat, trouble swallowing, neck stiffness, dental problem, voice change and tinnitus.   Eyes: Negative for pain, discharge and visual disturbance.  Respiratory: Negative for cough, chest tightness, wheezing and stridor.   Cardiovascular: Negative for chest pain, palpitations and leg swelling.  Gastrointestinal: Negative for nausea, vomiting, abdominal pain, diarrhea, constipation, blood in stool and abdominal distention.  Genitourinary: Negative for urgency, hematuria, flank pain, discharge, difficulty urinating and genital sores.  Musculoskeletal: Positive for gait problem. Negative for myalgias, back pain, joint swelling and arthralgias.  Skin: Negative for rash.  Neurological: Positive for weakness. Negative for dizziness, syncope, speech difficulty, numbness and headaches.  Hematological: Negative for adenopathy. Does not bruise/bleed easily.  Psychiatric/Behavioral: Negative for behavioral problems and dysphoric mood. The patient is not nervous/anxious.        Objective:   Physical Exam  Constitutional: He is oriented to person, place, and time. He appears well-developed.       Blood pressure 140/80 sitting  HENT:  Head: Normocephalic.  Right Ear: External ear normal.  Left Ear:  External ear normal.  Eyes: Conjunctivae and EOM are normal.  Neck: Normal range of motion.  Cardiovascular: Normal rate and normal heart sounds.   Pulmonary/Chest: Breath sounds normal.  Abdominal: Bowel sounds are normal.  Musculoskeletal: Normal range of motion. He exhibits no edema and no tenderness.  Neurological: He is alert and oriented to person, place, and time.  Skin:       Left arm bandaged  Psychiatric: He has a normal mood and affect. His behavior is normal.          Assessment & Plan:   Parkinson syndrome with orthostatic hypotension and mild gait instability Cognitive impairment. Family feels that he has not done as well off the Namenda we'll resume  We'll liberalize fluid and salt intake

## 2011-04-22 NOTE — H&P (Signed)
[name], SEEVER NO.:  [id number]  MEDICAL RECORD NO.:  [id number]  LOCATION:  2035                         FACILITY:  [facility]  PHYSICIAN:  Tarry Kos, MD       DATE OF BIRTH:  05/23/1922  DATE OF ADMISSION:  03/20/2011 DATE OF DISCHARGE:                             HISTORY & PHYSICAL   CHIEF COMPLAINT:  Fall.  HISTORY OF PRESENT ILLNESS:  [name] is a very pleasant 77 year old with mild dementia who lives with his wife who comes to the emergency department today because he fell down on the porch and was found by his wife and daughter on the ground.  [name] says that he was walking on the porch, he was alone, and he tripped and fell over a table and slowly went down and landed on his left side and he sounds like he specifically remembers exactly what happened, however, his history is somewhat unreliable because of his dementia.  He says he did not pass out.  He can describe exactly what happened that he slid down on the table and scraped up his arm and leg, and scraped his ear and unable to get up on his own and laid there for some time, sitting up in a sitting position until his wife and his daughter got back.  When they found him, he was bleeding.  He had multiple tears on his left upper arm and left knee, but he was lucid.  He told them that he had fallen.  He had not had any loss of bowel or bladder.  His wife says over the last several days he has been in his normal state of health.  There have been no fevers.  No cough.  No nausea, vomiting, or diarrhea.  She says though, however, that frequently upon standing and walking, he loses his balance and gets dizzy and he has had several falls over the last 6 months or so and she does not really understand why.  She says his blood pressure is normally low.  REVIEW OF SYSTEMS:  Otherwise negative.  PAST MEDICAL HISTORY: 1. Mild dementia. 2. Sounds like gait abnormality with frequent falls, unclear  as to     whether or not this is from orthostasis. 3. Parkinson's. 4. History of colon cancer and skin cancer. 5. GERD. 6. Osteoporosis. 7. Nephrolithiasis.  MEDICATIONS: 1. Fish oil 1000 mg twice a day. 2. Namenda 10 mg twice a day. 3. Multivitamin a day. 4. Budesonide 3 mg 2 capsules daily. 5. Prilosec 20 mg daily. 6. Cholestyramine 4 g 1 packet twice a day. 7. Lexapro 10 mg daily. 8. Plavix 75 mg daily.  ALLERGIES:  None.  SOCIAL HISTORY:  He is a nonsmoker.  No alcohol.  No IV drug abuse.  He is a full code.  He lives with his wife.  He does have some mild dementia.  He does not have issue with wandering.  PHYSICAL EXAMINATION:  VITAL SIGNS:  Temperature is 98.3, blood pressure 132/67, pulse 74, respirations 16, and 92% O2 sats on room air. GENERAL:  He is alert.  He is oriented to person and place, but not to time.  He answers questions appropriately.  He seems to recall the events that happened earlier today, however, he cannot remember what he ate for lunch or he cannot remember what he ate for breakfast this morning either. HEENT:  Extraocular muscles are intact.  Pupils are equal to light. Oropharynx is clear.  Mucous membranes are moist. NECK:  No JVD.  No carotid bruits. CARDIAC:  Regular rate and rhythm without murmurs or gallops. CHEST:  Clear to auscultation bilaterally.  No wheezes, rhonchi, or rales. ABDOMEN:  Soft, nontender, and nondistended.  Positive bowel sounds.  No hepatosplenomegaly. EXTREMITIES:  No clubbing, cyanosis, or edema. PSYCH:  Normal mood and affect. NEURO:  No focal neurologic deficits.  He has got full range of motion of all extremities, upper and lower. PELVIS:  Intact and stable without any pain with movement.  There is no hip pain.  He has multiple skin tears to his left upper and lower extremity which have been wrapped.  He has a very mild abrasion on his ear.  EKG is normal sinus rhythm without any acute ST-T wave  changes. Urinalysis is negative.  Cardiac enzymes are normal.  Electrolytes are normal.  BUN and creatinine are normal.  Coags are normal.  CBC is normal.  CT of his head, no acute issues.  C-spine is negative.  Left wrist and knee negative.  ASSESSMENT/PLAN:  This is an 77 year old male who had a fall with less likely syncopal episode. 1. Fall, question of syncopal episode.  It does not sound like he had     a syncopal episode.  I will observe him overnight, place him on     telemetry monitoring to rule out any arrhythmias.  He has no focal     neurological deficits at this time, so I am not going to order an     MRI.  We will obtain neurological checks every q.4 h.  It really     sounds like his falling issue is due to orthostatic hypotension.     We will check orthostasis right now, however, he has already     received IV fluids in the emergency department.  I do not know how     useful this will be at this time, but will check orthostatics and     observe overnight and serial his cardiac enzymes.  I have ordered a     2-D echo.  This cannot be done tomorrow.  This could be done as an     outpatient as the family only really wants to stay for 24 hours.     We will also check orthostatics again in the morning. 2. Frequent falls.  Again, it really sounds like this is from     orthostatics.  He does not stay well hydrated throughout the day.     I have discussed this with the family that he needs to drink enough     fluids throughout the day that this could be the issue.  I am also     going to obtain a physical therapy evaluation.  They do not have     home health or PT at home.  This might ought to be arranged when he     is discharged home.  He is a full code. 3. Dementia.  We will continue him on his Namenda.  Further     recommendation pending overall hospital course.          ______________________________ Tarry Kos, MD  RD/MEDQ  D:  03/21/2011  T:  03/21/2011  Job:   [id number]  Electronically Signed by Tarry Kos MD on 04/22/2011 11:47:26 AM

## 2011-04-26 ENCOUNTER — Telehealth: Payer: Self-pay

## 2011-04-26 MED ORDER — RIVASTIGMINE 9.5 MG/24HR TD PT24: 1.0000 | MEDICATED_PATCH | Freq: Every day | TRANSDERMAL | Status: DC

## 2011-04-26 NOTE — Telephone Encounter (Signed)
Faxed back to [facility].

## 2011-04-26 NOTE — Telephone Encounter (Signed)
Fax refill request for exelon patch - last seen 04/06/11 last written 10/20/10 #[phone] Please advise

## 2011-04-26 NOTE — Telephone Encounter (Signed)
OK  #[phone]  RF 4

## 2011-06-21 ENCOUNTER — Other Ambulatory Visit: Payer: Self-pay | Admitting: Dermatology

## 2011-07-06 ENCOUNTER — Encounter: Payer: Self-pay | Admitting: Internal Medicine

## 2011-07-06 ENCOUNTER — Other Ambulatory Visit: Payer: Self-pay | Admitting: Internal Medicine

## 2011-07-06 ENCOUNTER — Ambulatory Visit (INDEPENDENT_AMBULATORY_CARE_PROVIDER_SITE_OTHER): Payer: Federal, State, Local not specified - PPO | Admitting: Internal Medicine

## 2011-07-06 DIAGNOSIS — K219 Gastro-esophageal reflux disease without esophagitis: Secondary | ICD-10-CM

## 2011-07-06 DIAGNOSIS — G3184 Mild cognitive impairment, so stated: Secondary | ICD-10-CM

## 2011-07-06 DIAGNOSIS — R1310 Dysphagia, unspecified: Secondary | ICD-10-CM

## 2011-07-06 DIAGNOSIS — R197 Diarrhea, unspecified: Secondary | ICD-10-CM

## 2011-07-06 DIAGNOSIS — I10 Essential (primary) hypertension: Secondary | ICD-10-CM

## 2011-07-06 MED ORDER — MEMANTINE HCL 10 MG PO TABS: 10.0000 mg | ORAL_TABLET | Freq: Two times a day (BID) | ORAL | Status: DC

## 2011-07-06 NOTE — Progress Notes (Signed)
  Subjective:    Patient ID: [name], male    DOB: Aug 04, 1922, 77 y.o.   MRN: [id number]  HPI   Wt Readings from Last 3 Encounters:  07/06/11 161 lb (73.029 kg)  04/06/11 157 lb (71.215 kg)  03/23/11 154 lb (69.854 kg)      Review of Systems     Objective:   Physical Exam        Assessment & Plan:

## 2011-07-06 NOTE — Patient Instructions (Signed)
Swallowing study as discussed  Limit your sodium (Salt) intake    It is important that you exercise regularly, at least 20 minutes 3 to 4 times per week.  If you develop chest pain or shortness of breath seek  medical attention.  Avoids foods high in acid such as tomatoes citrus juices, and spicy foods.  Avoid eating within two hours of lying down or before exercising.  Do not overheat.  Try smaller more frequent meals.  If symptoms persist, elevate the head of her bed 12 inches while sleeping.  Return in 6 months for follow-up

## 2011-07-06 NOTE — Progress Notes (Signed)
  Subjective:    Patient ID: [name], male    DOB: August 10, 1922, 77 y.o.   MRN: [id number]  HPI  77 year old patient who is seen today for followup. He has remote history of colon cancer and continues to have intermittent diarrhea he does use Lomotil as needed he has a history of hypertension on cognitive impairment. He is doing reasonably well. Many complaints are episodic diarrhea and episodes of coughing associated with eating denies any true dysphagia but does have frequent episodes of coughing following meals. He was seen at the [facility] and they suggested a swallowing study.Marland Kitchen He continues to gain weight.    Review of Systems  Constitutional: Negative for fever, chills, appetite change and fatigue.  HENT: Negative for hearing loss, ear pain, congestion, sore throat, trouble swallowing, neck stiffness, dental problem, voice change and tinnitus.   Eyes: Negative for pain, discharge and visual disturbance.  Respiratory: Positive for cough. Negative for chest tightness, wheezing and stridor.   Cardiovascular: Negative for chest pain, palpitations and leg swelling.  Gastrointestinal: Positive for diarrhea. Negative for nausea, vomiting, abdominal pain, constipation, blood in stool and abdominal distention.  Genitourinary: Negative for urgency, hematuria, flank pain, discharge, difficulty urinating and genital sores.  Musculoskeletal: Negative for myalgias, back pain, joint swelling, arthralgias and gait problem.  Skin: Negative for rash.  Neurological: Negative for dizziness, syncope, speech difficulty, weakness, numbness and headaches.  Hematological: Negative for adenopathy. Does not bruise/bleed easily.  Psychiatric/Behavioral: Negative for behavioral problems and dysphoric mood. The patient is not nervous/anxious.        Objective:   Physical Exam  Constitutional: He is oriented to person, place, and time. He appears well-developed.  HENT:  Head: Normocephalic.  Right Ear:  External ear normal.  Left Ear: External ear normal.  Eyes: Conjunctivae and EOM are normal.  Neck: Normal range of motion.  Cardiovascular: Normal rate and normal heart sounds.   Pulmonary/Chest: Breath sounds normal.  Abdominal: Bowel sounds are normal.  Musculoskeletal: Normal range of motion. He exhibits no edema and no tenderness.  Neurological: He is alert and oriented to person, place, and time.  Psychiatric: He has a normal mood and affect. His behavior is normal.          Assessment & Plan:   Dysphasia. Will set up for a swallowing study Episodic diarrhea. Will continue Lomotil when necessary Cognitive dysfunction. We'll continue his present medicine  Recheck in 6 months  Hypertension well controlled Gastroesophageal reflux disease

## 2011-07-07 ENCOUNTER — Other Ambulatory Visit: Payer: Self-pay | Admitting: Internal Medicine

## 2011-07-07 ENCOUNTER — Encounter: Payer: Self-pay | Admitting: Internal Medicine

## 2011-07-07 ENCOUNTER — Other Ambulatory Visit (HOSPITAL_COMMUNITY): Payer: Self-pay | Admitting: Internal Medicine

## 2011-07-07 DIAGNOSIS — R05 Cough: Secondary | ICD-10-CM

## 2011-07-15 ENCOUNTER — Ambulatory Visit (HOSPITAL_COMMUNITY)
Admission: RE | Admit: 2011-07-15 | Discharge: 2011-07-15 | Disposition: A | Discharge status: Home / Self Care | Payer: Federal, State, Local not specified - PPO | Source: Ambulatory Visit | Attending: Internal Medicine | Admitting: Internal Medicine

## 2011-07-15 DIAGNOSIS — R05 Cough: Secondary | ICD-10-CM | POA: Insufficient documentation

## 2011-07-15 DIAGNOSIS — R059 Cough, unspecified: Secondary | ICD-10-CM

## 2011-07-15 DIAGNOSIS — R1310 Dysphagia, unspecified: Secondary | ICD-10-CM | POA: Insufficient documentation

## 2011-07-22 ENCOUNTER — Encounter: Payer: Self-pay | Admitting: Internal Medicine

## 2011-07-30 ENCOUNTER — Telehealth: Payer: Self-pay

## 2011-07-30 NOTE — Telephone Encounter (Signed)
Pt's wife called and stated that pt does not feel well, she states pt sleeps around the clock, is having a hard time breathing, unsteady, and weak.  Asked pt's wife if pt had experienced this before.  Pt's wife stated that pt had an occurrence like this before but over the past few days he had gotten severe.  Pt has not started any new medications, pt's appetite has not changed, and pt does not have any other symptoms such as fever etc.  Pt's wife states her husband does not have a history of asthma or COPD.    Pt's wife encouraged to take pt to the [facility] ER. Pt's wife stated she wanted to bring husband in on Monday but she will call back to schedule an appt due to pt's physician  being out of the office.   Again, encouraged a visit to the ER.

## 2011-07-31 ENCOUNTER — Other Ambulatory Visit: Payer: Self-pay | Admitting: Internal Medicine

## 2011-08-05 ENCOUNTER — Emergency Department (HOSPITAL_COMMUNITY): Payer: Federal, State, Local not specified - PPO

## 2011-08-05 ENCOUNTER — Emergency Department (HOSPITAL_COMMUNITY)
Admission: EM | Admit: 2011-08-05 | Discharge: 2011-08-05 | Disposition: A | Discharge status: Home / Self Care | Payer: Federal, State, Local not specified - PPO | Attending: Emergency Medicine | Admitting: Emergency Medicine

## 2011-08-05 ENCOUNTER — Encounter (HOSPITAL_COMMUNITY): Payer: Self-pay | Admitting: *Deleted

## 2011-08-05 ENCOUNTER — Other Ambulatory Visit: Payer: Self-pay

## 2011-08-05 DIAGNOSIS — I4460 Unspecified fascicular block: Secondary | ICD-10-CM | POA: Insufficient documentation

## 2011-08-05 DIAGNOSIS — I4949 Other premature depolarization: Secondary | ICD-10-CM | POA: Insufficient documentation

## 2011-08-05 DIAGNOSIS — R05 Cough: Secondary | ICD-10-CM | POA: Insufficient documentation

## 2011-08-05 DIAGNOSIS — I509 Heart failure, unspecified: Secondary | ICD-10-CM | POA: Insufficient documentation

## 2011-08-05 DIAGNOSIS — I491 Atrial premature depolarization: Secondary | ICD-10-CM | POA: Insufficient documentation

## 2011-08-05 DIAGNOSIS — R059 Cough, unspecified: Secondary | ICD-10-CM | POA: Insufficient documentation

## 2011-08-05 DIAGNOSIS — M810 Age-related osteoporosis without current pathological fracture: Secondary | ICD-10-CM | POA: Insufficient documentation

## 2011-08-05 DIAGNOSIS — Z8673 Personal history of transient ischemic attack (TIA), and cerebral infarction without residual deficits: Secondary | ICD-10-CM | POA: Insufficient documentation

## 2011-08-05 DIAGNOSIS — K219 Gastro-esophageal reflux disease without esophagitis: Secondary | ICD-10-CM | POA: Insufficient documentation

## 2011-08-05 DIAGNOSIS — J811 Chronic pulmonary edema: Secondary | ICD-10-CM | POA: Insufficient documentation

## 2011-08-05 DIAGNOSIS — I517 Cardiomegaly: Secondary | ICD-10-CM | POA: Insufficient documentation

## 2011-08-05 DIAGNOSIS — R0602 Shortness of breath: Secondary | ICD-10-CM | POA: Insufficient documentation

## 2011-08-05 DIAGNOSIS — I10 Essential (primary) hypertension: Secondary | ICD-10-CM | POA: Insufficient documentation

## 2011-08-05 LAB — DIFFERENTIAL
Eosinophils Absolute: 0.1 10*3/uL (ref 0.0–0.7)
Eosinophils Relative: 2 % (ref 0–5)
Lymphocytes Relative: 13 % (ref 12–46)
Lymphs Abs: 1 10*3/uL (ref 0.7–4.0)
Monocytes Absolute: 0.8 10*3/uL (ref 0.1–1.0)

## 2011-08-05 LAB — CBC
HCT: 34.9 % — ABNORMAL LOW (ref 39.0–52.0)
MCH: 32.7 pg (ref 26.0–34.0)
MCV: 102 fL — ABNORMAL HIGH (ref 78.0–100.0)
RBC: 3.42 MIL/uL — ABNORMAL LOW (ref 4.22–5.81)
RDW: 13.5 % (ref 11.5–15.5)
WBC: 7.2 10*3/uL (ref 4.0–10.5)

## 2011-08-05 LAB — BASIC METABOLIC PANEL
BUN: 14 mg/dL (ref 6–23)
CO2: 28 mEq/L (ref 19–32)
Calcium: 9 mg/dL (ref 8.4–10.5)
Creatinine, Ser: 0.91 mg/dL (ref 0.50–1.35)
GFR calc non Af Amer: 74 mL/min — ABNORMAL LOW (ref >90)
Glucose, Bld: 98 mg/dL (ref 70–99)

## 2011-08-05 MED ORDER — FUROSEMIDE 10 MG/ML IJ SOLN
40.0000 mg | Freq: Once | INTRAMUSCULAR | Status: AC
  Administered 2011-08-05: 40 mg via INTRAVENOUS
  Filled 2011-08-05: qty 4

## 2011-08-05 NOTE — ED Notes (Signed)
Attempted IV unable paged IV team.  

## 2011-08-05 NOTE — ED Provider Notes (Signed)
History     CSN: [id number] Arrival date & time: 08/05/2011  1:43 PM   First MD Initiated Contact with Patient 08/05/11 1357      Chief Complaint  Patient presents with  . Shortness of Breath    (Consider location/radiation/quality/duration/timing/severity/associated sxs/prior treatment) HPI  Past Medical History  Diagnosis Date  . COLON CANCER, HX OF 04/03/2007  . DEPRESSION 04/03/2007  . Diarrhea 06/20/2008  . DIVERTICULITIS, HX OF 04/03/2007  . DIVERTICULOSIS, COLON 04/03/2007  . DYSPNEA ON EXERTION 03/10/2009  . GERD 08/31/2007  . HYPERTENSION 04/03/2007  . MILD COGNITIVE IMPAIRMENT SO STATED 08/01/2008  . NEPHROLITHIASIS, HX OF 04/03/2007  . Neurasthenia 03/10/2009  . OSTEOPOROSIS 08/31/2007  . OTH&UNSPEC NONINFECTIOUS GASTROENTERITIS&COLITIS 08/28/2008  . PROSTATE CANCER, HX OF 04/03/2007  . RADIATION PROCTITIS 01/14/2010  . SKIN CANCER, HX OF 04/03/2007  . TRANSIENT ISCHEMIC ATTACK 08/14/2009    History reviewed. No pertinent past surgical history.  History reviewed. No pertinent family history.  History  Substance Use Topics  . Smoking status: Former Smoker    Quit date: 04/07/1979  . Smokeless tobacco: Not on file  . Alcohol Use: Not on file      Review of Systems  Allergies  Review of patient's allergies indicates no known allergies.  Home Medications   Current Outpatient Rx  Name Route Sig Dispense Refill  . BUDESONIDE 3 MG PO CP24 Oral Take 9 mg by mouth every morning.      Marland Kitchen CLOPIDOGREL BISULFATE 75 MG PO TABS  TAKE 1 TABLET BY MOUTH EVERY DAY 90 tablet 1  . DIPHENOXYLATE-ATROPINE 2.5-0.025 MG PO TABS Oral Take 1 tablet by mouth 4 (four) times daily as needed. For diarrhea     . DOXYCYCLINE HYCLATE 100 MG PO CAPS Oral Take 100 mg by mouth 2 (two) times daily.      Marland Kitchen ESCITALOPRAM OXALATE 20 MG PO TABS Oral Take 20 mg by mouth daily.      Marland Kitchen FAMOTIDINE 20 MG PO TABS Oral Take 20 mg by mouth at bedtime.      Marland Kitchen MEMANTINE HCL 10 MG PO TABS Oral Take 1 tablet (10 mg  total) by mouth 2 (two) times daily. 180 tablet 6  . PRESERVISION/LUTEIN PO Oral Take by mouth daily.      Marland Kitchen FISH OIL 1000 MG PO CAPS Oral Take 2 capsules by mouth daily.      Marland Kitchen ALIGN 4 MG PO CAPS Oral Take by mouth. Daily      . RIVASTIGMINE 9.5 MG/24HR TD PT24 Transdermal Place 1 patch (9.5 mg total) onto the skin daily. 90 patch 3  . HYDROCODONE-ACETAMINOPHEN 5-500 MG PO TABS Oral Take 1 tablet by mouth every 4 (four) hours as needed. For pain       BP 163/74  Pulse 68  Temp(Src) 98.1 F (36.7 C) (Oral)  Resp 16  SpO2 96%  Physical Exam  ED Course  Procedures (including critical care time)  Labs Reviewed  CBC - Abnormal; Notable for the following:    RBC 3.42 (*)    Hemoglobin 11.2 (*)    HCT 34.9 (*)    MCV 102.0 (*)    All other components within normal limits  BASIC METABOLIC PANEL - Abnormal; Notable for the following:    GFR calc non Af Amer 74 (*)    GFR calc Af Amer 86 (*)    All other components within normal limits  DIFFERENTIAL   Dg Chest 2 View  08/05/2011  *RADIOLOGY REPORT*  Clinical Data: 1-week history of shortness of breath and generalized weakness.  Bilateral ankle swelling.  CHEST - 2 VIEW 08/05/2011:  Comparison: Two-view chest x-ray 03/21/2011, 08/14/2009, 07/22/2009 [facility] and 02/28/2005 [facility].  Findings: Cardiac silhouette enlarged, stable dating back to October, 2010, with slight increase in size since 2006.  Thoracic aorta mildly tortuous and atherosclerotic, unchanged.  Hilar and mediastinal contours otherwise unremarkable.  Interval development of mild diffuse interstitial pulmonary edema, superimposed upon baseline changes of COPD.  No confluent airspace consolidation.  No pleural effusions.  Degenerative changes involving the thoracic spine.  IMPRESSION: Mild CHF, with stable cardiomegaly and mild diffuse interstitial pulmonary edema.  Original Report Authenticated By: Arnell Sieving, M.D.     1. Congestive heart  failure    Patient seen and evaluated for congestive heart failure. Patient started on Lasix IV in the emergency department. Patient resting comfortably. Patient's oxygen saturation at 97%. No respiratory distress. Advise close followup with his primary care physician who is Dr. Amador Cunas.  5:27 PM Patient seen and evaluated.  VSS reviewed. . Nursing notes reviewed. Discussed with attending physician, Dr. Weldon Inches. Advised patient received Lasix in the emergency room and then be discharged. Patient will be further evaluated by his primary care physician. Initial testing ordered. Will monitor the patient closely. They agree with the treatment plan and diagnosis.     6:45 PM Patient seen and re-evaluated. Notified of testing results. Lasix given in the Ed. Reports improvement in her symptoms. No other complaints. No SOB or CP. 100% on RA. Normal respiratory effort.   Results for orders placed during the hospital encounter of 08/05/11  CBC      Component Value Range   WBC 7.2  4.0 - 10.5 (K/uL)   RBC 3.42 (*) 4.22 - 5.81 (MIL/uL)   Hemoglobin 11.2 (*) 13.0 - 17.0 (g/dL)   HCT 16.1 (*) 09.6 - 52.0 (%)   MCV 102.0 (*) 78.0 - 100.0 (fL)   MCH 32.7  26.0 - 34.0 (pg)   MCHC 32.1  30.0 - 36.0 (g/dL)   RDW 04.5  40.9 - 81.1 (%)   Platelets 195  150 - 400 (K/uL)  DIFFERENTIAL      Component Value Range   Neutrophils Relative 73  43 - 77 (%)   Neutro Abs 5.3  1.7 - 7.7 (K/uL)   Lymphocytes Relative 13  12 - 46 (%)   Lymphs Abs 1.0  0.7 - 4.0 (K/uL)   Monocytes Relative 11  3 - 12 (%)   Monocytes Absolute 0.8  0.1 - 1.0 (K/uL)   Eosinophils Relative 2  0 - 5 (%)   Eosinophils Absolute 0.1  0.0 - 0.7 (K/uL)   Basophils Relative 1  0 - 1 (%)   Basophils Absolute 0.0  0.0 - 0.1 (K/uL)  BASIC METABOLIC PANEL      Component Value Range   Sodium 140  135 - 145 (mEq/L)   Potassium 4.1  3.5 - 5.1 (mEq/L)   Chloride 106  96 - 112 (mEq/L)   CO2 28  19 - 32 (mEq/L)   Glucose, Bld 98  70 - 99 (mg/dL)    BUN 14  6 - 23 (mg/dL)   Creatinine, Ser 9.14  0.50 - 1.35 (mg/dL)   Calcium 9.0  8.4 - 78.2 (mg/dL)   GFR calc non Af Amer 74 (*) >90 (mL/min)   GFR calc Af Amer 86 (*) >90 (mL/min)   Dg Chest 2 View  08/05/2011  *  RADIOLOGY REPORT*  Clinical Data: 1-week history of shortness of breath and generalized weakness.  Bilateral ankle swelling.  CHEST - 2 VIEW 08/05/2011:  Comparison: Two-view chest x-ray 03/21/2011, 08/14/2009, 07/22/2009 [facility] and 02/28/2005 [facility].  Findings: Cardiac silhouette enlarged, stable dating back to October, 2010, with slight increase in size since 2006.  Thoracic aorta mildly tortuous and atherosclerotic, unchanged.  Hilar and mediastinal contours otherwise unremarkable.  Interval development of mild diffuse interstitial pulmonary edema, superimposed upon baseline changes of COPD.  No confluent airspace consolidation.  No pleural effusions.  Degenerative changes involving the thoracic spine.  IMPRESSION: Mild CHF, with stable cardiomegaly and mild diffuse interstitial pulmonary edema.  Original Report Authenticated By: Arnell Sieving, M.D.   Dg Swallowing Func-no Report  07/20/2011  FLUOROSCOPY FOR SWALLOWING FUNCTION STUDY  Fluoroscopy was provided for swallowing function study, which was a dministered by a speech pathologist.  Final results and recommendat ions from this study are contained within the speech pathology repo rt.  Original Report Authenticated By: [id number]    7:02 PM Spoke to Dr. Cato Mulligan who recommended that the patient follow up in the office. Sent him a message to schedule a follow up visit.   MDM  CHF        Demetrius Charity, PA 08/05/11 1853  Demetrius Charity, PA 08/05/11 1906

## 2011-08-05 NOTE — ED Provider Notes (Signed)
History     CSN: [id number] Arrival date & time: 08/05/2011  1:43 PM   First MD Initiated Contact with Patient 08/05/11 1357      Chief Complaint  Patient presents with  . Shortness of Breath    (Consider location/radiation/quality/duration/timing/severity/associated sxs/prior treatment) Patient is a 77 y.o. male presenting with shortness of breath. The history is provided by the patient, the spouse and a relative.  Shortness of Breath  Associated symptoms include cough and shortness of breath. Pertinent negatives include no chest pain, no fever and no wheezing.    Past Medical History  Diagnosis Date  . COLON CANCER, HX OF 04/03/2007  . DEPRESSION 04/03/2007  . Diarrhea 06/20/2008  . DIVERTICULITIS, HX OF 04/03/2007  . DIVERTICULOSIS, COLON 04/03/2007  . DYSPNEA ON EXERTION 03/10/2009  . GERD 08/31/2007  . HYPERTENSION 04/03/2007  . MILD COGNITIVE IMPAIRMENT SO STATED 08/01/2008  . NEPHROLITHIASIS, HX OF 04/03/2007  . Neurasthenia 03/10/2009  . OSTEOPOROSIS 08/31/2007  . OTH&UNSPEC NONINFECTIOUS GASTROENTERITIS&COLITIS 08/28/2008  . PROSTATE CANCER, HX OF 04/03/2007  . RADIATION PROCTITIS 01/14/2010  . SKIN CANCER, HX OF 04/03/2007  . TRANSIENT ISCHEMIC ATTACK 08/14/2009    History reviewed. No pertinent past surgical history.  History reviewed. No pertinent family history.  History  Substance Use Topics  . Smoking status: Former Smoker    Quit date: 04/07/1979  . Smokeless tobacco: Not on file  . Alcohol Use: Not on file      Review of Systems  Constitutional: Negative for fever and chills.  HENT: Negative for congestion.   Eyes: Negative for visual disturbance.  Respiratory: Positive for cough and shortness of breath. Negative for chest tightness and wheezing.   Cardiovascular: Negative for chest pain.  Gastrointestinal: Negative for nausea, vomiting and abdominal pain.  Skin: Negative for rash.  Neurological: Negative for headaches.  Psychiatric/Behavioral: Negative for  confusion.    Allergies  Review of patient's allergies indicates no known allergies.  Home Medications   Current Outpatient Rx  Name Route Sig Dispense Refill  . BUDESONIDE 3 MG PO CP24 Oral Take 9 mg by mouth every morning.      Marland Kitchen CLOPIDOGREL BISULFATE 75 MG PO TABS  TAKE 1 TABLET BY MOUTH EVERY DAY 90 tablet 1  . DIPHENOXYLATE-ATROPINE 2.5-0.025 MG PO TABS Oral Take 1 tablet by mouth 4 (four) times daily as needed. For diarrhea     . DOXYCYCLINE HYCLATE 100 MG PO CAPS Oral Take 100 mg by mouth 2 (two) times daily.      Marland Kitchen ESCITALOPRAM OXALATE 20 MG PO TABS Oral Take 20 mg by mouth daily.      Marland Kitchen FAMOTIDINE 20 MG PO TABS Oral Take 20 mg by mouth at bedtime.      Marland Kitchen MEMANTINE HCL 10 MG PO TABS Oral Take 1 tablet (10 mg total) by mouth 2 (two) times daily. 180 tablet 6  . PRESERVISION/LUTEIN PO Oral Take by mouth daily.      Marland Kitchen FISH OIL 1000 MG PO CAPS Oral Take 2 capsules by mouth daily.      Marland Kitchen ALIGN 4 MG PO CAPS Oral Take by mouth. Daily      . RIVASTIGMINE 9.5 MG/24HR TD PT24 Transdermal Place 1 patch (9.5 mg total) onto the skin daily. 90 patch 3  . HYDROCODONE-ACETAMINOPHEN 5-500 MG PO TABS Oral Take 1 tablet by mouth every 4 (four) hours as needed. For pain       BP 153/74  Pulse 73  Temp(Src) 98.1 F (36.7 C) (  Oral)  Resp 22  SpO2 90%  Physical Exam  Constitutional: He is oriented to person, place, and time. He appears well-developed and well-nourished. No distress.  HENT:  Head: Normocephalic and atraumatic.  Eyes: EOM are normal. Pupils are equal, round, and reactive to light.  Neck: Normal range of motion. Neck supple.  Cardiovascular: Normal rate, regular rhythm, normal heart sounds and intact distal pulses.   No murmur heard.      Occasional PAC  Pulmonary/Chest: Effort normal. No respiratory distress. He has no wheezes. He has rales.       Rales bilateral bases  Abdominal: Soft. Bowel sounds are normal. He exhibits no distension and no mass. There is no tenderness.  There is no rebound and no guarding.  Musculoskeletal: Normal range of motion. He exhibits edema. He exhibits no tenderness.       Bilateral lower extremity 2+ edema  Neurological: He is alert and oriented to person, place, and time. No cranial nerve deficit.  Skin: Skin is warm and dry. He is not diaphoretic.  Psychiatric: He has a normal mood and affect. His behavior is normal.    ED Course  Procedures (including critical care time)  77 year old, male, with hypertension, presents with progressive dyspnea on exertion, nonproductive cough, and lower extremity swelling.  No history of congestive heart failure, but that's what I suspect.  The chest x-ray, and blood tests for evaluation and treatment.  Depending on the test results  Labs Reviewed  CBC - Abnormal; Notable for the following:    RBC 3.42 (*)    Hemoglobin 11.2 (*)    HCT 34.9 (*)    MCV 102.0 (*)    All other components within normal limits  BASIC METABOLIC PANEL - Abnormal; Notable for the following:    GFR calc non Af Amer 74 (*)    GFR calc Af Amer 86 (*)    All other components within normal limits  DIFFERENTIAL   Dg Chest 2 View  08/05/2011  *RADIOLOGY REPORT*  Clinical Data: 1-week history of shortness of breath and generalized weakness.  Bilateral ankle swelling.  CHEST - 2 VIEW 08/05/2011:  Comparison: Two-view chest x-ray 03/21/2011, 08/14/2009, 07/22/2009 [facility] and 02/28/2005 [facility].  Findings: Cardiac silhouette enlarged, stable dating back to October, 2010, with slight increase in size since 2006.  Thoracic aorta mildly tortuous and atherosclerotic, unchanged.  Hilar and mediastinal contours otherwise unremarkable.  Interval development of mild diffuse interstitial pulmonary edema, superimposed upon baseline changes of COPD.  No confluent airspace consolidation.  No pleural effusions.  Degenerative changes involving the thoracic spine.  IMPRESSION: Mild CHF, with stable cardiomegaly and  mild diffuse interstitial pulmonary edema.  Original Report Authenticated By: Arnell Sieving, M.D.     No diagnosis found.  ED ECG REPORT   Date: 08/05/2011  EKG Time: 14:44  Rate: 77  Rhythm: normal sinus rhythm, occ. pvc,LAFB  Axis: left axis  Intervals:none  ST&T Change: nonspecific changes  Narrative Interpretation: Sinus rhythm with left axis deviation, left anterior fascicular block, and occasional PAC and PVC           We'll establish an IV and give Lasix placed in the CDU and then reevaluate after treatment.  The patient should be able to go home with Lasix.  MDM  Mild congestive heart failure, without respiratory distress.        Nicholes Stairs, MD 08/05/11 1606

## 2011-08-05 NOTE — ED Notes (Signed)
Pt to cdu for admin of lasix. Pt's family reports pt with sob and increasing fatigue x 2 weeks. Denies pain. Pt in no acute distress, a&ox3.

## 2011-08-05 NOTE — ED Notes (Signed)
Malachi Bonds showed Dr Weldon Inches New and and old EKG per [facility].

## 2011-08-05 NOTE — ED Notes (Signed)
Pt presents with 2 week h/o shortness of breath.  Daughter and wife at bedside for history, reports symptoms have worsened over the past 2 weeks.  +cough with intermittent phlegm.  Pt had cyst removed from R temporal area on Monday, site is covered by clear gauze.  Rales noted to R side to posterior field.  Daughter reports pt has had swallowing test done recently due to food getting "stuck" when pt eats, but denies any aspiration of food into lungs. Bruising noted to R orbit.

## 2011-08-05 NOTE — ED Notes (Signed)
States feeling significantly better. Swelling visually noted to be decreased in lower extremities. PA aware.

## 2011-08-05 NOTE — ED Notes (Signed)
To ed for eval of increasing sob and feeling tired over the past cple of weeks. Pt is able to lay flat without difficulty. Denies cp

## 2011-08-06 ENCOUNTER — Encounter: Payer: Self-pay | Admitting: Internal Medicine

## 2011-08-06 ENCOUNTER — Telehealth: Payer: Self-pay | Admitting: Internal Medicine

## 2011-08-06 ENCOUNTER — Ambulatory Visit (INDEPENDENT_AMBULATORY_CARE_PROVIDER_SITE_OTHER): Payer: Self-pay | Admitting: Internal Medicine

## 2011-08-06 DIAGNOSIS — I509 Heart failure, unspecified: Secondary | ICD-10-CM

## 2011-08-06 DIAGNOSIS — R0609 Other forms of dyspnea: Secondary | ICD-10-CM

## 2011-08-06 MED ORDER — FUROSEMIDE 20 MG PO TABS: 20.0000 mg | ORAL_TABLET | Freq: Every day | ORAL | Status: DC

## 2011-08-06 NOTE — Progress Notes (Signed)
  Subjective:    Patient ID: [name], male    DOB: 01/16/1922, 77 y.o.   MRN: [id number]  HPI  77 year old patient who was treated at the emergency room yesterday for congestive heart failure. He received an infusion of IV Lasix which has been quite helpful. Chest x-ray was consistent with congestive heart failure symptoms have included increasing shortness or breath and peripheral edema he denies any chest pain although he has been somewhat listless throughout the day he has had no breathing difficulty. He apparently slept flat last night without difficulty. His wife feels that he has had worsening pedal edema throughout the day today he was discharged from the emergency room approximately 24 hours ago    Review of Systems  Constitutional: Positive for fatigue. Negative for fever, chills and appetite change.  HENT: Negative for hearing loss, ear pain, congestion, sore throat, trouble swallowing, neck stiffness, dental problem, voice change and tinnitus.   Eyes: Negative for pain, discharge and visual disturbance.  Respiratory: Positive for shortness of breath. Negative for cough, chest tightness, wheezing and stridor.   Cardiovascular: Positive for leg swelling. Negative for chest pain and palpitations.  Gastrointestinal: Negative for nausea, vomiting, abdominal pain, diarrhea, constipation, blood in stool and abdominal distention.  Genitourinary: Negative for urgency, hematuria, flank pain, discharge, difficulty urinating and genital sores.  Musculoskeletal: Negative for myalgias, back pain, joint swelling, arthralgias and gait problem.  Skin: Negative for rash.  Neurological: Positive for weakness. Negative for dizziness, syncope, speech difficulty, numbness and headaches.  Hematological: Negative for adenopathy. Does not bruise/bleed easily.  Psychiatric/Behavioral: Negative for behavioral problems and dysphoric mood. The patient is not nervous/anxious.        Objective:   Physical  Exam  Constitutional: He is oriented to person, place, and time. He appears well-developed.       Appears frail but in no acute distress. Blood pressure 100/60  HENT:  Head: Normocephalic.  Right Ear: External ear normal.  Left Ear: External ear normal.  Eyes: Conjunctivae and EOM are normal.  Neck: Normal range of motion. JVD present.  Cardiovascular: Normal rate and normal heart sounds.        Frequent ectopics  Pulmonary/Chest: He has rales.       Rales involving both lower one third lung fields  Abdominal: Bowel sounds are normal.  Musculoskeletal: Normal range of motion. He exhibits edema. He exhibits no tenderness.       +2 edema  Neurological: He is alert and oriented to person, place, and time.  Psychiatric: He has a normal mood and affect. His behavior is normal.          Assessment & Plan:    Congestive heart failure. Patient is still volume overloaded. Will start on daily furosemide 20 mg. We'll give a dose today and start every morning. A salt restricted diet encouraged. He reported a significant weight gain or shortness of breath. He return in one week for followup. If his blood pressure is more stable may consider adding ACE inhibition  Senile dementia History depression

## 2011-08-06 NOTE — ED Provider Notes (Signed)
Medical screening examination/treatment/procedure(s) were conducted as a shared visit with non-physician practitioner(s) and myself.  I personally evaluated the patient during the encounter  Nicholes Stairs, MD 08/06/11 1240

## 2011-08-06 NOTE — Telephone Encounter (Signed)
Called pt back and told him that Dr Amador Cunas can see pt today at 4:30pm, as noted.

## 2011-08-06 NOTE — Telephone Encounter (Signed)
Per dr. Amador Cunas - will see last pt of the day at 430

## 2011-08-06 NOTE — Patient Instructions (Signed)
Limit your sodium (Salt) intake  Return office visit one week 

## 2011-08-06 NOTE — Telephone Encounter (Signed)
Pts wife called and said that her husband was just discharged from hospital re: congestive hospital. She said that the hospital told the pt that he needs a hosp fup with pcp either today or tomorrow. Pts wife is insisting that pt be worked in today. Pls call.

## 2011-08-09 ENCOUNTER — Telehealth: Payer: Self-pay | Admitting: Internal Medicine

## 2011-08-09 NOTE — Telephone Encounter (Signed)
Pt has been in hosp for congestive heart failure and is sch to come in on Friday 08/13/11. Pt is wondering what his dietary restrictions, esp with fat. Also what should pt expect with this new dx. Pls call Olegario Messier or Susy Frizzle to discuss.

## 2011-08-09 NOTE — Telephone Encounter (Signed)
Spoke with Olegario Messier and matt- discuss 2gm Na diet , low fat diet suggestions. Will discuss at rov on friday

## 2011-08-09 NOTE — Telephone Encounter (Signed)
Salt restricted diet;  Will discuss all next rov

## 2011-08-13 ENCOUNTER — Ambulatory Visit (INDEPENDENT_AMBULATORY_CARE_PROVIDER_SITE_OTHER): Payer: Self-pay | Admitting: Internal Medicine

## 2011-08-13 ENCOUNTER — Encounter: Payer: Self-pay | Admitting: Internal Medicine

## 2011-08-13 DIAGNOSIS — F488 Other specified nonpsychotic mental disorders: Secondary | ICD-10-CM

## 2011-08-13 DIAGNOSIS — R0609 Other forms of dyspnea: Secondary | ICD-10-CM

## 2011-08-13 DIAGNOSIS — R0989 Other specified symptoms and signs involving the circulatory and respiratory systems: Secondary | ICD-10-CM

## 2011-08-13 DIAGNOSIS — I509 Heart failure, unspecified: Secondary | ICD-10-CM

## 2011-08-13 LAB — BASIC METABOLIC PANEL
BUN: 27 mg/dL — ABNORMAL HIGH (ref 6–23)
Calcium: 9.5 mg/dL (ref 8.4–10.5)
Creatinine, Ser: 1.1 mg/dL (ref 0.4–1.5)
GFR: 65.82 mL/min (ref >60.00)
Glucose, Bld: 95 mg/dL (ref 70–99)

## 2011-08-13 NOTE — Progress Notes (Signed)
Subjective:    Patient ID: [name], male    DOB: May 09, 1922, 77 y.o.   MRN: [id number]  HPI  Wt Readings from Last 3 Encounters:  08/13/11 150 lb (68.04 kg)  08/06/11 156 lb (70.761 kg)  07/06/11 161 lb (73.46 kg)    77 year old patient who is seen today for followup of his congestive heart failure. His peripheral edema has resolved and he has lost additional weight. He seems weak with hypersomnolence in the morning but perks up later in the afternoon he seems to do fairly well. He continues to have dyspnea on exertion. Weight is down 6 pounds over the past 7 days;  he really has improved. No exertional chest pain  Past Medical History  Diagnosis Date  . COLON CANCER, HX OF 04/03/2007  . DEPRESSION 04/03/2007  . Diarrhea 06/20/2008  . DIVERTICULITIS, HX OF 04/03/2007  . DIVERTICULOSIS, COLON 04/03/2007  . DYSPNEA ON EXERTION 03/10/2009  . GERD 08/31/2007  . HYPERTENSION 04/03/2007  . MILD COGNITIVE IMPAIRMENT SO STATED 08/01/2008  . NEPHROLITHIASIS, HX OF 04/03/2007  . Neurasthenia 03/10/2009  . OSTEOPOROSIS 08/31/2007  . OTH&UNSPEC NONINFECTIOUS GASTROENTERITIS&COLITIS 08/28/2008  . PROSTATE CANCER, HX OF 04/03/2007  . RADIATION PROCTITIS 01/14/2010  . SKIN CANCER, HX OF 04/03/2007  . TRANSIENT ISCHEMIC ATTACK 08/14/2009    History   Social History  . Marital Status: Married    Spouse Name: N/A    Number of Children: N/A  . Years of Education: N/A   Occupational History  . Not on file.   Social History Main Topics  . Smoking status: Former Smoker    Quit date: 04/07/1979  . Smokeless tobacco: Not on file  . Alcohol Use: Not on file  . Drug Use: Not on file  . Sexually Active: Not on file   Other Topics Concern  . Not on file   Social History Narrative  . No narrative on file    No past surgical history on file.  No family history on file.  No Known Allergies  Current Outpatient Prescriptions on File Prior to Visit  Medication Sig Dispense Refill  . budesonide  (ENTOCORT EC) 3 MG 24 hr capsule Take 9 mg by mouth every morning.        . clopidogrel (PLAVIX) 75 MG tablet TAKE 1 TABLET BY MOUTH EVERY DAY  90 tablet  1  . diphenoxylate-atropine (LOMOTIL) 2.5-0.025 MG per tablet Take 1 tablet by mouth 4 (four) times daily as needed. For diarrhea       . doxycycline (VIBRAMYCIN) 100 MG capsule Take 100 mg by mouth 2 (two) times daily.        Marland Kitchen escitalopram (LEXAPRO) 20 MG tablet Take 20 mg by mouth daily.        . famotidine (PEPCID) 20 MG tablet Take 20 mg by mouth at bedtime.        . furosemide (LASIX) 20 MG tablet Take 1 tablet (20 mg total) by mouth daily.  30 tablet  11  . HYDROcodone-acetaminophen (VICODIN) 5-500 MG per tablet Take 1 tablet by mouth every 4 (four) hours as needed. For pain       . memantine (NAMENDA) 10 MG tablet Take 1 tablet (10 mg total) by mouth 2 (two) times daily.  180 tablet  6  . Multiple Vitamins-Minerals (PRESERVISION/LUTEIN PO) Take by mouth daily.       . Omega-3 Fatty Acids (FISH OIL) 1000 MG CAPS Take 2 capsules by mouth daily.        Marland Kitchen  Probiotic Product (ALIGN) 4 MG CAPS Take by mouth. Daily       . rivastigmine (EXELON) 9.5 mg/24hr Place 1 patch (9.5 mg total) onto the skin daily.  90 patch  3  . vitamin B-12 (CYANOCOBALAMIN) 1000 MCG tablet Take 1,000 mcg by mouth 2 (two) times daily.          BP 90/60  Temp(Src) 97.6 F (36.4 C) (Oral)  Wt 150 lb (68.04 kg)     Review of Systems  Constitutional: Positive for fatigue. Negative for fever, chills and appetite change.  HENT: Negative for hearing loss, ear pain, congestion, sore throat, trouble swallowing, neck stiffness, dental problem, voice change and tinnitus.   Eyes: Negative for pain, discharge and visual disturbance.  Respiratory: Negative for cough, chest tightness, wheezing and stridor.   Cardiovascular: Negative for chest pain, palpitations and leg swelling (Resolved).  Gastrointestinal: Negative for nausea, vomiting, abdominal pain, diarrhea,  constipation, blood in stool and abdominal distention.  Genitourinary: Negative for urgency, hematuria, flank pain, discharge, difficulty urinating and genital sores.  Musculoskeletal: Negative for myalgias, back pain, joint swelling, arthralgias and gait problem.  Skin: Negative for rash.  Neurological: Positive for weakness and light-headedness. Negative for dizziness, syncope, speech difficulty, numbness and headaches.  Hematological: Negative for adenopathy. Does not bruise/bleed easily.  Psychiatric/Behavioral: Negative for behavioral problems and dysphoric mood. The patient is not nervous/anxious.        Objective:   Physical Exam  Constitutional: He is oriented to person, place, and time. He appears well-developed and well-nourished. No distress.       Vital. Blood pressure 88/56  HENT:  Head: Normocephalic.  Right Ear: External ear normal.  Left Ear: External ear normal.  Eyes: Conjunctivae and EOM are normal.  Neck: Normal range of motion. No JVD present.  Cardiovascular: Normal rate and normal heart sounds.        Irregular with a controlled ventricular response  Pulmonary/Chest: Effort normal. He has rales.       Bibasilar rales  Abdominal: Bowel sounds are normal.  Musculoskeletal: Normal range of motion. He exhibits no edema and no tenderness.  Neurological: He is alert and oriented to person, place, and time.  Psychiatric: He has a normal mood and affect. His behavior is normal.          Assessment & Plan:   Congestive heart failure. This has improved his weight is down 6 pounds in his peripheral edema has resolved. He still has some dyspnea on exertion. Blood pressure still too low to consider beta blocker therapy or ACE inhibition. We'll continue a low-dose diuretic therapy. We'll check electrolytes today and recheck in 10 days

## 2011-08-13 NOTE — Patient Instructions (Signed)
Limit your sodium (Salt) intake  Recheck in 10 days  Check your weight daily;  notify this office if there is any weight gain in excess of 3 pounds

## 2011-08-15 ENCOUNTER — Other Ambulatory Visit: Payer: Self-pay | Admitting: Internal Medicine

## 2011-08-18 ENCOUNTER — Telehealth: Payer: Self-pay | Admitting: Internal Medicine

## 2011-08-18 NOTE — Telephone Encounter (Signed)
Pt daughter called,pt is on a low sodium diet and she would like to know if she can use a salt substitute. Please contact

## 2011-08-18 NOTE — Telephone Encounter (Signed)
Spoke with daughter - discussed diet

## 2011-08-23 ENCOUNTER — Ambulatory Visit (INDEPENDENT_AMBULATORY_CARE_PROVIDER_SITE_OTHER): Payer: Medicare Other | Admitting: Internal Medicine

## 2011-08-23 ENCOUNTER — Encounter: Payer: Self-pay | Admitting: Internal Medicine

## 2011-08-23 DIAGNOSIS — R0609 Other forms of dyspnea: Secondary | ICD-10-CM

## 2011-08-23 DIAGNOSIS — R0989 Other specified symptoms and signs involving the circulatory and respiratory systems: Secondary | ICD-10-CM

## 2011-08-23 DIAGNOSIS — F488 Other specified nonpsychotic mental disorders: Secondary | ICD-10-CM

## 2011-08-23 DIAGNOSIS — I509 Heart failure, unspecified: Secondary | ICD-10-CM

## 2011-08-23 LAB — BASIC METABOLIC PANEL
CO2: 31 mEq/L (ref 19–32)
Calcium: 9.8 mg/dL (ref 8.4–10.5)
Creatinine, Ser: 1.2 mg/dL (ref 0.4–1.5)
GFR: 58.52 mL/min — ABNORMAL LOW (ref >60.00)

## 2011-08-23 LAB — BRAIN NATRIURETIC PEPTIDE: Pro B Natriuretic peptide (BNP): 219 pg/mL — ABNORMAL HIGH (ref 0.0–100.0)

## 2011-08-23 MED ORDER — FUROSEMIDE 20 MG PO TABS: 20.0000 mg | ORAL_TABLET | Freq: Every day | ORAL | Status: DC

## 2011-08-23 NOTE — Patient Instructions (Signed)
Limit your sodium (Salt) intake  . Return in 3 weeks for followup  Report any significant weight gain

## 2011-08-23 NOTE — Progress Notes (Signed)
  Subjective:    Patient ID: [name], male    DOB: [birth year], 77 y.o.   MRN: [id number]  HPI  Wt Readings from Last 3 Encounters:  08/23/11 152 lb (68.947 kg)  08/13/11 150 lb (68.04 kg)  08/06/11 156 lb (70.60 kg)   77 year old patient who is seen today for followup. He has a history of congestive heart failure. He was last seen 10 days ago but missed her time his mental status seems to wax and wane. He continues to have more somnolence in the morning. Presently is on daily furosemide 20 mg every morning. His weight is up 2 pounds over the past 10 days. He seems fairly stable. He seems to have less dyspnea on exertion than in the past. There is no peripheral edema. His blood pressure has been low and beta blocker therapy or ACE inhibition has not been added to his regimen. He also continues to have bradycardia in the low 60 range   Review of Systems  Constitutional: Positive for fatigue. Negative for fever, chills and appetite change.  HENT: Negative for hearing loss, ear pain, congestion, sore throat, trouble swallowing, neck stiffness, dental problem, voice change and tinnitus.   Eyes: Negative for pain, discharge and visual disturbance.  Respiratory: Positive for shortness of breath. Negative for cough, chest tightness, wheezing and stridor.   Cardiovascular: Negative for chest pain, palpitations and leg swelling.  Gastrointestinal: Negative for nausea, vomiting, abdominal pain, diarrhea, constipation, blood in stool and abdominal distention.  Genitourinary: Negative for urgency, hematuria, flank pain, discharge, difficulty urinating and genital sores.  Musculoskeletal: Negative for myalgias, back pain, joint swelling, arthralgias and gait problem.  Skin: Negative for rash.  Neurological: Positive for weakness. Negative for dizziness, syncope, speech difficulty, numbness and headaches.  Hematological: Negative for adenopathy. Does not bruise/bleed easily.  Psychiatric/Behavioral:  Negative for behavioral problems and dysphoric mood. The patient is not nervous/anxious.        Objective:   Physical Exam  Constitutional: He is oriented to person, place, and time. He appears well-developed. No distress.       Comfortable at rest. No distress Blood pressure on arrival 110/70  Blood pressure by my exam 90/60  HENT:  Head: Normocephalic.  Right Ear: External ear normal.  Left Ear: External ear normal.  Eyes: Conjunctivae and EOM are normal.  Neck: Normal range of motion.  Cardiovascular: Normal rate and normal heart sounds.        Regular regular with a rate of 60-64  Pulmonary/Chest: Effort normal. He has no rales.       Rales involving the lower one third lung fields bilaterally  Abdominal: Bowel sounds are normal.  Musculoskeletal: Normal range of motion. He exhibits no edema and no tenderness.       No pedal edema  Neurological: He is alert and oriented to person, place, and time.  Psychiatric: He has a normal mood and affect. His behavior is normal.          Assessment & Plan:   Congestive heart failure. Seems fairly well compensated although oxygen saturation 93%. Remains hypotensive. We'll check electrolytes and BNP. Do not feel he can tolerate beta blocker therapy or ACE inhibition at this time  Recheck 3 weeks The family will  l report any worsening symptoms or weight gain

## 2011-09-02 ENCOUNTER — Ambulatory Visit: Payer: Federal, State, Local not specified - PPO | Admitting: Internal Medicine

## 2011-09-03 ENCOUNTER — Other Ambulatory Visit: Payer: Self-pay | Admitting: Internal Medicine

## 2011-09-10 ENCOUNTER — Other Ambulatory Visit: Payer: Self-pay

## 2011-09-10 ENCOUNTER — Emergency Department (HOSPITAL_COMMUNITY): Payer: Federal, State, Local not specified - PPO

## 2011-09-10 ENCOUNTER — Emergency Department (HOSPITAL_COMMUNITY)
Admission: EM | Admit: 2011-09-10 | Discharge: 2011-09-11 | Disposition: A | Discharge status: Home / Self Care | Payer: Federal, State, Local not specified - PPO | Attending: Emergency Medicine | Admitting: Emergency Medicine

## 2011-09-10 DIAGNOSIS — M810 Age-related osteoporosis without current pathological fracture: Secondary | ICD-10-CM | POA: Insufficient documentation

## 2011-09-10 DIAGNOSIS — IMO0002 Reserved for concepts with insufficient information to code with codable children: Secondary | ICD-10-CM | POA: Insufficient documentation

## 2011-09-10 DIAGNOSIS — Z85038 Personal history of other malignant neoplasm of large intestine: Secondary | ICD-10-CM | POA: Insufficient documentation

## 2011-09-10 DIAGNOSIS — Z79899 Other long term (current) drug therapy: Secondary | ICD-10-CM | POA: Insufficient documentation

## 2011-09-10 DIAGNOSIS — W19XXXA Unspecified fall, initial encounter: Secondary | ICD-10-CM

## 2011-09-10 DIAGNOSIS — F329 Major depressive disorder, single episode, unspecified: Secondary | ICD-10-CM | POA: Insufficient documentation

## 2011-09-10 DIAGNOSIS — G319 Degenerative disease of nervous system, unspecified: Secondary | ICD-10-CM | POA: Insufficient documentation

## 2011-09-10 DIAGNOSIS — Z8546 Personal history of malignant neoplasm of prostate: Secondary | ICD-10-CM | POA: Insufficient documentation

## 2011-09-10 DIAGNOSIS — I10 Essential (primary) hypertension: Secondary | ICD-10-CM | POA: Insufficient documentation

## 2011-09-10 DIAGNOSIS — K219 Gastro-esophageal reflux disease without esophagitis: Secondary | ICD-10-CM | POA: Insufficient documentation

## 2011-09-10 DIAGNOSIS — W010XXA Fall on same level from slipping, tripping and stumbling without subsequent striking against object, initial encounter: Secondary | ICD-10-CM | POA: Insufficient documentation

## 2011-09-10 DIAGNOSIS — F3289 Other specified depressive episodes: Secondary | ICD-10-CM | POA: Insufficient documentation

## 2011-09-10 DIAGNOSIS — Z8673 Personal history of transient ischemic attack (TIA), and cerebral infarction without residual deficits: Secondary | ICD-10-CM | POA: Insufficient documentation

## 2011-09-10 LAB — BASIC METABOLIC PANEL
Calcium: 9.2 mg/dL (ref 8.4–10.5)
Chloride: 102 mEq/L (ref 96–112)
Creatinine, Ser: 1.24 mg/dL (ref 0.50–1.35)
GFR calc Af Amer: 58 mL/min — ABNORMAL LOW (ref >90)

## 2011-09-10 LAB — CBC
HCT: 39.3 % (ref 39.0–52.0)
MCHC: 32.8 g/dL (ref 30.0–36.0)
Platelets: 198 10*3/uL (ref 150–400)
RDW: 12.8 % (ref 11.5–15.5)

## 2011-09-10 LAB — DIFFERENTIAL
Basophils Absolute: 0.1 10*3/uL (ref 0.0–0.1)
Basophils Relative: 1 % (ref 0–1)
Monocytes Absolute: 0.7 10*3/uL (ref 0.1–1.0)
Neutro Abs: 5.3 10*3/uL (ref 1.7–7.7)

## 2011-09-10 LAB — TROPONIN I: Troponin I: <0.3 ng/mL (ref <0.30)

## 2011-09-10 MED ORDER — SODIUM CHLORIDE 0.9 % IV BOLUS (SEPSIS): 500.0000 mL | INTRAVENOUS | Status: DC

## 2011-09-10 NOTE — ED Notes (Signed)
Patient presented via EMS after a fall at home.  Does not know why he fell.  Has a small hematoma to the back of his head small amount of bleeding.  Denies LOC  Removed from short spine board, collar remains on  Patient alert and attempts to answer questions but  Gets very angry with himself due to the fact that he could not answer all questions

## 2011-09-10 NOTE — ED Provider Notes (Signed)
History     CSN: [id number] Arrival date & time: 09/10/2011  7:42 PM   First MD Initiated Contact with Patient 09/10/11 1945      Chief Complaint  Patient presents with  . Fall   HPI  Patient presents to the emergency room after a fall that occurred just prior to arrival. Reports that he was walking when he fell backwards. Denies having any pain. Denies any loc. However, after talking to his wife and daughter they report that in the past few weeks he has had intermittent episodes of starring and then he goes limp. He is not responsive during this time, he does not remember this occuring. Daughter also reports that he looses bladder incontinence when this occurs. Denies any LOC. Denies any weakness/numbness. Denies any neurofocal deficits. Reports that he has an appointment with Dr. Arvin Collard for this problem on Monday. Denies any chest pain, SOB, weakness or numbness. Patient denies any other complaints at this time.   Past Medical History  Diagnosis Date  . COLON CANCER, HX OF 04/03/2007  . DEPRESSION 04/03/2007  . Diarrhea 06/20/2008  . DIVERTICULITIS, HX OF 04/03/2007  . DIVERTICULOSIS, COLON 04/03/2007  . DYSPNEA ON EXERTION 03/10/2009  . GERD 08/31/2007  . HYPERTENSION 04/03/2007  . MILD COGNITIVE IMPAIRMENT SO STATED 08/01/2008  . NEPHROLITHIASIS, HX OF 04/03/2007  . Neurasthenia 03/10/2009  . OSTEOPOROSIS 08/31/2007  . OTH&UNSPEC NONINFECTIOUS GASTROENTERITIS&COLITIS 08/28/2008  . PROSTATE CANCER, HX OF 04/03/2007  . RADIATION PROCTITIS 01/14/2010  . SKIN CANCER, HX OF 04/03/2007  . TRANSIENT ISCHEMIC ATTACK 08/14/2009    No past surgical history on file.  No family history on file.  History  Substance Use Topics  . Smoking status: Former Smoker    Quit date: 04/07/1979  . Smokeless tobacco: Not on file  . Alcohol Use: Not on file      Review of Systems  Constitutional: Negative for fever, chills, diaphoresis and appetite change.  HENT: Negative for neck pain.   Eyes: Negative  for photophobia and visual disturbance.  Respiratory: Negative for cough, chest tightness and shortness of breath.   Cardiovascular: Negative for chest pain.  Gastrointestinal: Negative for nausea, vomiting and abdominal pain.  Genitourinary: Negative for flank pain.  Musculoskeletal: Negative for back pain.  Skin: Negative for rash.  Neurological: Negative for dizziness, tremors, syncope, facial asymmetry, speech difficulty, weakness, light-headedness, numbness and headaches.  Psychiatric/Behavioral: Negative for confusion.  All other systems reviewed and are negative.    Allergies  Review of patient's allergies indicates no known allergies.  Home Medications   Current Outpatient Rx  Name Route Sig Dispense Refill  . BUDESONIDE ER 3 MG PO CP24 Oral Take 9 mg by mouth every morning.      Marland Kitchen CLOPIDOGREL BISULFATE 75 MG PO TABS  TAKE 1 TABLET BY MOUTH EVERY DAY 90 tablet 1  . DIPHENOXYLATE-ATROPINE 2.5-0.025 MG PO TABS Oral Take 1 tablet by mouth 4 (four) times daily as needed. For diarrhea     . ESCITALOPRAM OXALATE 20 MG PO TABS  TAKE 1 TABLET BY MOUTH EVERY DAY 90 tablet 3  . EXELON 9.5 MG/24HR TD PT24  APPLY 1 PATCH DAILY AS DIRECTED 30 each 3  . FAMOTIDINE 20 MG PO TABS Oral Take 20 mg by mouth at bedtime.      . FUROSEMIDE 20 MG PO TABS Oral Take 1 tablet (20 mg total) by mouth daily. 90 tablet 11  . HYDROCODONE-ACETAMINOPHEN 5-500 MG PO TABS Oral Take 1 tablet by mouth every  4 (four) hours as needed. For pain     . MEMANTINE HCL 10 MG PO TABS Oral Take 1 tablet (10 mg total) by mouth 2 (two) times daily. 180 tablet 6  . PRESERVISION/LUTEIN PO Oral Take by mouth daily.     Marland Kitchen FISH OIL 1000 MG PO CAPS Oral Take 2 capsules by mouth daily.      Marland Kitchen ALIGN 4 MG PO CAPS Oral Take by mouth. Daily     . VITAMIN B-12 1000 MCG PO TABS Oral Take 1,000 mcg by mouth 2 (two) times daily.        BP 106/64  Pulse 71  Temp(Src) 98.2 F (36.8 C) (Oral)  Resp 18  SpO2 96%  Physical Exam    Nursing note and vitals reviewed. Constitutional: He is oriented to person, place, and time. He appears well-developed and well-nourished.  Non-toxic appearance. He does not appear ill. No distress.  HENT:  Head: Normocephalic and atraumatic. No trismus in the jaw.  Right Ear: Tympanic membrane and external ear normal.  Left Ear: Tympanic membrane and external ear normal.  Nose: Nose normal. No nasal septal hematoma.  No foreign bodies.  Mouth/Throat: Oropharynx is clear and moist. No oropharyngeal exudate.       No septal hematoma or hemotympanum.  Eyes: EOM and lids are normal. Pupils are equal, round, and reactive to light.  Neck: Normal range of motion. Neck supple. No tracheal tenderness, no spinous process tenderness and no muscular tenderness present. No rigidity. Normal range of motion present.       c-collar  Cardiovascular: Normal rate, regular rhythm, S1 normal, S2 normal, normal heart sounds and intact distal pulses.   Pulmonary/Chest: Effort normal and breath sounds normal. He has no wheezes. He exhibits no tenderness.  Abdominal: Soft. Bowel sounds are normal. He exhibits no distension. There is no tenderness. There is no rebound and no guarding.  Musculoskeletal: Normal range of motion. He exhibits no edema and no tenderness.       Right shoulder: He exhibits no tenderness.       Left shoulder: He exhibits no tenderness.       Right elbow: no tenderness found.       Left elbow: no tenderness found.       Right hip: He exhibits no tenderness.       Left hip: He exhibits no tenderness.       Right knee: no tenderness found.       Left knee: no tenderness found.       Right ankle: no tenderness.       Left ankle: no tenderness.       Cervical back: He exhibits no tenderness.       Thoracic back: He exhibits no tenderness.       Lumbar back: He exhibits no tenderness.  Neurological: He is alert and oriented to person, place, and time. He has normal strength and normal  reflexes. No cranial nerve deficit or sensory deficit. He exhibits normal muscle tone. He displays a negative Romberg sign. Coordination and gait normal. GCS eye subscore is 4. GCS verbal subscore is 5. GCS motor subscore is 6.  Skin: Skin is warm and dry. No rash noted. He is not diaphoretic. No pallor.       No lacerations. Abrasion noted to the posterior scalp.  Psychiatric: He has a normal mood and affect. His behavior is normal. Judgment and thought content normal.    ED Course  Procedures (including  critical care time)  Patient seen and evaluated.  VSS reviewed. . Nursing notes reviewed. Discussed with attending physician. Initial testing ordered. Will monitor the patient closely. They agree with the treatment plan and diagnosis.   Results for orders placed during the hospital encounter of 09/10/11  TROPONIN I      Component Value Range   Troponin I <0.30  <0.30 (ng/mL)  CBC      Component Value Range   WBC 7.7  4.0 - 10.5 (K/uL)   RBC 3.96 (*) 4.22 - 5.81 (MIL/uL)   Hemoglobin 12.9 (*) 13.0 - 17.0 (g/dL)   HCT 40.9  81.1 - 91.4 (%)   MCV 99.2  78.0 - 100.0 (fL)   MCH 32.6  26.0 - 34.0 (pg)   MCHC 32.8  30.0 - 36.0 (g/dL)   RDW 78.2  95.6 - 21.3 (%)   Platelets 198  150 - 400 (K/uL)  DIFFERENTIAL      Component Value Range   Neutrophils Relative 69  43 - 77 (%)   Neutro Abs 5.3  1.7 - 7.7 (K/uL)   Lymphocytes Relative 18  12 - 46 (%)   Lymphs Abs 1.4  0.7 - 4.0 (K/uL)   Monocytes Relative 10  3 - 12 (%)   Monocytes Absolute 0.7  0.1 - 1.0 (K/uL)   Eosinophils Relative 2  0 - 5 (%)   Eosinophils Absolute 0.2  0.0 - 0.7 (K/uL)   Basophils Relative 1  0 - 1 (%)   Basophils Absolute 0.1  0.0 - 0.1 (K/uL)  BASIC METABOLIC PANEL      Component Value Range   Sodium 138  135 - 145 (mEq/L)   Potassium 4.4  3.5 - 5.1 (mEq/L)   Chloride 102  96 - 112 (mEq/L)   CO2 26  19 - 32 (mEq/L)   Glucose, Bld 107 (*) 70 - 99 (mg/dL)   BUN 21  6 - 23 (mg/dL)   Creatinine, Ser 0.86  0.50  - 1.35 (mg/dL)   Calcium 9.2  8.4 - 57.8 (mg/dL)   GFR calc non Af Amer 50 (*) >90 (mL/min)   GFR calc Af Amer 58 (*) >90 (mL/min)   Dg Chest 2 View  09/10/2011  *RADIOLOGY REPORT*  Clinical Data: Status post fall  CHEST - 2 VIEW  Comparison: 08/05/2011  Findings: Cardiomegaly.  Mild central vascular prominence.  Mild chronic interstitial markings and hyperinflation, similar to prior, likely chronic.  No focal areas of consolidation with exception of a linear left lung base opacity, likely scarring or atelectasis. Aortic arch atherosclerotic calcification.  No pleural effusion or pneumothorax. Osteopenia.  No definite acute osseous abnormality with mild multilevel degenerative changes.  IMPRESSION: Cardiomegaly.  No definite acute process.  Original Report Authenticated By: Waneta Martins, M.D.   Ct Head Wo Contrast  09/10/2011  *RADIOLOGY REPORT*  Clinical Data:  Fall.  CT HEAD WITHOUT CONTRAST CT CERVICAL SPINE WITHOUT CONTRAST  Technique:  Multidetector CT imaging of the head and cervical spine was performed following the standard protocol without intravenous contrast.  Multiplanar CT image reconstructions of the cervical spine were also generated.  Comparison:  CT 03/21/2011  CT HEAD  Findings: Generalized atrophy.  Mild chronic microvascular ischemia in the white matter.  Negative for acute infarct.  Negative for hemorrhage or mass lesion.  Negative for skull fracture.  IMPRESSION: Atrophy and chronic ischemic change.  No acute abnormality.  CT CERVICAL SPINE  Findings: Negative for fracture.  Disc degeneration and spondylosis  at C3-4, C4-5, and C5-6.  Right- sided facet degeneration and right foraminal encroachment at C3-4. Mild foraminal narrowing bilaterally at C5-6 due to spurring. Small central disc protrusion C6-7.  Atherosclerotic calcification in the carotid artery, most severe on the right  IMPRESSION: Negative for fracture.  Original Report Authenticated By: Camelia Phenes, M.D.   Ct  Cervical Spine Wo Contrast  09/10/2011  *RADIOLOGY REPORT*  Clinical Data:  Fall.  CT HEAD WITHOUT CONTRAST CT CERVICAL SPINE WITHOUT CONTRAST  Technique:  Multidetector CT imaging of the head and cervical spine was performed following the standard protocol without intravenous contrast.  Multiplanar CT image reconstructions of the cervical spine were also generated.  Comparison:  CT 03/21/2011  CT HEAD  Findings: Generalized atrophy.  Mild chronic microvascular ischemia in the white matter.  Negative for acute infarct.  Negative for hemorrhage or mass lesion.  Negative for skull fracture.  IMPRESSION: Atrophy and chronic ischemic change.  No acute abnormality.  CT CERVICAL SPINE  Findings: Negative for fracture.  Disc degeneration and spondylosis at C3-4, C4-5, and C5-6.  Right- sided facet degeneration and right foraminal encroachment at C3-4. Mild foraminal narrowing bilaterally at C5-6 due to spurring. Small central disc protrusion C6-7.  Atherosclerotic calcification in the carotid artery, most severe on the right  IMPRESSION: Negative for fracture.  Original Report Authenticated By: Camelia Phenes, M.D.    Patient Vitals for the past 24 hrs:  BP Temp Temp src Pulse Resp SpO2  09/10/11 2201 83/44 mmHg - - 75  - -  09/10/11 2200 116/45 mmHg - - 71  - -  09/10/11 2159 113/53 mmHg - - 71  - -  09/10/11 2130 114/46 mmHg - - 139  18  93 %  09/10/11 2030 125/52 mmHg - - 70  19  94 %  09/10/11 2015 126/66 mmHg - - 37  18  94 %  09/10/11 1948 106/64 mmHg 98.2 F (36.8 C) Oral 71  18  96 %  09/10/11 1945 99/46 mmHg - - 61  - 93 %    Patient seen and re-evaluated. Resting comfortably. VSS stable. NAD. Patient and family notified of testing results. Stated agreement and understanding. Patient stated understanding to treatment plan and diagnosis. Patient orthostatic, gave bolus of IVF. D/w dr. Manus Gunning. No further workup needed at this time.    Date: 09/10/2011, 2003  Rate: 78  Rhythm: normal sinus  rhythm  QRS Axis: left  Intervals: normal  ST/T Wave abnormalities: nonspecific ST/T changes  Conduction Disutrbances:PVC  Narrative Interpretation:   Old EKG Reviewed: unchanged  11:40 PM patient was orthostatic, IVF given. Patient symptoms improved.   MDM  Marzetta Merino, PA 09/10/11 [phone]

## 2011-09-10 NOTE — ED Notes (Signed)
Fall due to balance.  Fell yesterday but did not seek medical attention recd skin tear  Today fell has small hematoma to the back of his head.  Denies dizziness, N/V

## 2011-09-10 NOTE — ED Notes (Signed)
Asked pt. for urine sample. Patient stated he did not have to go at the moment.

## 2011-09-11 NOTE — ED Provider Notes (Signed)
Medical screening examination/treatment/procedure(s) were conducted as a shared visit with non-physician practitioner(s) and myself.  I personally evaluated the patient during the encounter  Fall on getting up from standing, no LOC.  Neuro intact.  Orthostatic here.  Sinus rhythm with PVCs.  Glynn Octave, MD 09/11/11 1224

## 2011-09-13 ENCOUNTER — Ambulatory Visit (INDEPENDENT_AMBULATORY_CARE_PROVIDER_SITE_OTHER): Payer: Medicare Other | Admitting: Internal Medicine

## 2011-09-13 ENCOUNTER — Encounter: Payer: Self-pay | Admitting: Internal Medicine

## 2011-09-13 DIAGNOSIS — F488 Other specified nonpsychotic mental disorders: Secondary | ICD-10-CM

## 2011-09-13 DIAGNOSIS — R0989 Other specified symptoms and signs involving the circulatory and respiratory systems: Secondary | ICD-10-CM

## 2011-09-13 DIAGNOSIS — I509 Heart failure, unspecified: Secondary | ICD-10-CM

## 2011-09-13 DIAGNOSIS — G3184 Mild cognitive impairment, so stated: Secondary | ICD-10-CM

## 2011-09-13 DIAGNOSIS — R0609 Other forms of dyspnea: Secondary | ICD-10-CM

## 2011-09-13 MED ORDER — FUROSEMIDE 20 MG PO TABS: ORAL_TABLET | ORAL | Status: DC

## 2011-09-13 NOTE — Progress Notes (Signed)
  Subjective:    Patient ID: [name], male    DOB: Sep 03, 1922, 77 y.o.   MRN: [id number]  HPI  77 year old patient who has a history of congestive heart failure and is on daily low-dose furosemide. He fell 2 days ago and required an emergency room evaluation. He continues to have episodes of weakness and diminished level of consciousness. These usually occur when  he is in a standing position.  Denies any peripheral edema but does have occasional shortness of breath. Treatment of congestive heart failure has been hampered by hypotension. He did receive IV fluids in the ED setting 3 days ago    Review of Systems  Constitutional: Positive for fatigue.  Respiratory: Positive for shortness of breath.   Neurological: Positive for dizziness, weakness and light-headedness.       Objective:   Physical Exam  Constitutional: He is oriented to person, place, and time. He appears well-developed.       90/60  HENT:  Head: Normocephalic.  Right Ear: External ear normal.  Left Ear: External ear normal.  Eyes: Conjunctivae and EOM are normal.  Neck: Normal range of motion.  Cardiovascular: Normal rate and normal heart sounds.   Pulmonary/Chest: Breath sounds normal. He has no rales.       Rales lower one third lung field  Abdominal: Bowel sounds are normal.  Musculoskeletal: Normal range of motion. He exhibits no edema and no tenderness.  Neurological: He is alert and oriented to person, place, and time.  Psychiatric: He has a normal mood and affect. His behavior is normal.          Assessment & Plan:   Congestive heart failure. Fairly well compensated Hypotension.  Will decrease furosemide to 20 mg Monday Wednesday Friday Cognitive impairment Weakness  Continue restricted salt diet. Recheck 1 month

## 2011-09-13 NOTE — Patient Instructions (Signed)
Limit your sodium (Salt) intake  Return in one month for follow-up 

## 2011-10-11 ENCOUNTER — Ambulatory Visit (INDEPENDENT_AMBULATORY_CARE_PROVIDER_SITE_OTHER): Payer: Medicare Other | Admitting: Internal Medicine

## 2011-10-11 ENCOUNTER — Encounter: Payer: Self-pay | Admitting: Internal Medicine

## 2011-10-11 DIAGNOSIS — G3184 Mild cognitive impairment, so stated: Secondary | ICD-10-CM

## 2011-10-11 DIAGNOSIS — I509 Heart failure, unspecified: Secondary | ICD-10-CM

## 2011-10-11 MED ORDER — NYSTATIN-TRIAMCINOLONE 100000-0.1 UNIT/GM-% EX OINT: TOPICAL_OINTMENT | Freq: Two times a day (BID) | CUTANEOUS | Status: DC

## 2011-10-11 NOTE — Patient Instructions (Signed)
Limit your sodium (Salt) intake    It is important that you exercise regularly, at least 20 minutes 3 to 4 times per week.  If you develop chest pain or shortness of breath seek  medical attention. 

## 2011-10-11 NOTE — Progress Notes (Signed)
  Subjective:    Patient ID: [name], male    DOB: 1921/10/21, 78 y.o.   MRN: [id number]  HPI   Wt Readings from Last 3 Encounters:  10/11/11 152 lb (68.947 kg)  09/13/11 151 lb (68.493 kg)  08/23/11 152 lb (68.51 kg)   78 year old patient who has a history of systolic heart failure. Over the past month he has done well he still sleeps most the morning but that in the afternoon does quite well. Denies any shortness of breath or peripheral edema. His weight has been stable. He has been maintained on furosemide 20 mg Monday Wednesday Friday. He is accompanied by his wife and daughter today and both feel he has improved  Review of Systems  Constitutional: Positive for fatigue. Negative for fever, chills and appetite change.  HENT: Negative for hearing loss, ear pain, congestion, sore throat, trouble swallowing, neck stiffness, dental problem, voice change and tinnitus.   Eyes: Negative for pain, discharge and visual disturbance.  Respiratory: Negative for cough, chest tightness, wheezing and stridor.   Cardiovascular: Negative for chest pain, palpitations and leg swelling.  Gastrointestinal: Negative for nausea, vomiting, abdominal pain, diarrhea, constipation, blood in stool and abdominal distention.  Genitourinary: Negative for urgency, hematuria, flank pain, discharge, difficulty urinating and genital sores.  Musculoskeletal: Negative for myalgias, back pain, joint swelling, arthralgias and gait problem.  Skin: Negative for rash.  Neurological: Positive for weakness. Negative for dizziness, syncope, speech difficulty, numbness and headaches.  Hematological: Negative for adenopathy. Does not bruise/bleed easily.  Psychiatric/Behavioral: Negative for behavioral problems and dysphoric mood. The patient is not nervous/anxious.        Objective:   Physical Exam  Constitutional: He is oriented to person, place, and time. He appears well-developed and well-nourished. No distress.   Blood pressure 104/70  HENT:  Head: Normocephalic.  Right Ear: External ear normal.  Left Ear: External ear normal.  Eyes: Conjunctivae and EOM are normal.  Neck: Normal range of motion.  Cardiovascular: Normal rate and normal heart sounds.        No tachycardia  Pulmonary/Chest: Effort normal. No respiratory distress. He has no wheezes. He has rales.       Bibasilar rales  Abdominal: Soft. Bowel sounds are normal.  Musculoskeletal: Normal range of motion. He exhibits no edema and no tenderness.       No peripheral edema  Neurological: He is alert and oriented to person, place, and time.  Psychiatric: He has a normal mood and affect. His behavior is normal.          Assessment & Plan:   Congestive heart failure compensated. The patient still sounds a bit wet on clinical examination of the chest but he has no peripheral edema and no shortness of breath. His blood pressure remains low normal. We'll continue present regimen unless symptoms change or there is weight gain. We'll reassess in 2 months

## 2011-11-11 ENCOUNTER — Other Ambulatory Visit: Payer: Self-pay | Admitting: Dermatology

## 2011-11-22 ENCOUNTER — Other Ambulatory Visit: Payer: Self-pay | Admitting: Gastroenterology

## 2011-11-24 ENCOUNTER — Other Ambulatory Visit: Payer: Self-pay | Admitting: Gastroenterology

## 2011-11-24 MED ORDER — BUDESONIDE 3 MG PO CP24: 9.0000 mg | ORAL_CAPSULE | ORAL | Status: DC

## 2011-11-24 NOTE — Telephone Encounter (Signed)
Informed wife that he needs to schedule a office visit and we can give him one refill until he comes in for his office visit. Pt scheduled for 12/15/11 at 2:15pm. Refill sent to the pharmacy.

## 2011-12-01 ENCOUNTER — Telehealth: Payer: Self-pay | Admitting: *Deleted

## 2011-12-01 ENCOUNTER — Encounter: Payer: Self-pay | Admitting: Internal Medicine

## 2011-12-01 ENCOUNTER — Ambulatory Visit (INDEPENDENT_AMBULATORY_CARE_PROVIDER_SITE_OTHER): Payer: Medicare Other | Admitting: Internal Medicine

## 2011-12-01 DIAGNOSIS — I509 Heart failure, unspecified: Secondary | ICD-10-CM

## 2011-12-01 DIAGNOSIS — G3184 Mild cognitive impairment, so stated: Secondary | ICD-10-CM

## 2011-12-01 DIAGNOSIS — I959 Hypotension, unspecified: Secondary | ICD-10-CM

## 2011-12-01 NOTE — Telephone Encounter (Signed)
Wife called  Stating pt' s CHF is worse.  Appt given today with Dr. Joyce Gross.

## 2011-12-01 NOTE — Patient Instructions (Signed)
Limit your sodium (Salt) intake  Return office visit 2 months  

## 2011-12-01 NOTE — Progress Notes (Signed)
  Subjective:    Patient ID: [name], male    DOB: [birth year], 78 y.o.   MRN: [id number]  HPI  78 year old patient who has a history of congestive heart failure. He has been maintained on furosemide 10 mg Monday Wednesday Friday. He has mild pedal edema late in the day that resolves by morning. Has some mild dyspnea on exertion but no significant cardiopulmonary complaints. No orthopnea or PND.  He has been intolerant of additional cardioactive medications due to hypotension. Blood pressure reading today sitting was 90/64 although 100/70 on arrival after ambulation to the examining room. His family describes frequent episodes of weakness with brief diminished level of consciousness to the point where he would likely fall if he did not have assistance. This usually occurs when he stands from a sitting position;  he has had some falls   Review of Systems  Constitutional: Positive for fatigue.  Respiratory: Positive for shortness of breath.   Cardiovascular: Positive for leg swelling.  Musculoskeletal: Positive for gait problem.  Neurological: Positive for weakness and light-headedness.       Objective:   Physical Exam  Constitutional:       Alert frail no distress  Blood pressure 90/64 sitting  Neck: No JVD present.  Cardiovascular: Regular rhythm.   Pulmonary/Chest: Effort normal.       Bibasilar rales  Musculoskeletal: He exhibits no edema.          Assessment & Plan:    orthostatic hypotension. We'll discontinue Monday Wednesday Friday furosemide and changed to a when necessary regimen for access of the lid edema Congestive heart failure  Low salt diet recommended. Recheck in 2 months or as needed

## 2011-12-13 ENCOUNTER — Ambulatory Visit: Payer: Medicare Other | Admitting: Internal Medicine

## 2011-12-15 ENCOUNTER — Ambulatory Visit (INDEPENDENT_AMBULATORY_CARE_PROVIDER_SITE_OTHER): Payer: Federal, State, Local not specified - PPO | Admitting: Gastroenterology

## 2011-12-15 ENCOUNTER — Encounter: Payer: Self-pay | Admitting: Gastroenterology

## 2011-12-15 VITALS — BP 98/60 | HR 64 | Ht 68.0 in | Wt 148.0 lb

## 2011-12-15 DIAGNOSIS — K5289 Other specified noninfective gastroenteritis and colitis: Secondary | ICD-10-CM

## 2011-12-15 MED ORDER — BUDESONIDE 3 MG PO CP24: 9.0000 mg | ORAL_CAPSULE | ORAL | Status: DC

## 2011-12-15 MED ORDER — DIPHENOXYLATE-ATROPINE 2.5-0.025 MG PO TABS: 1.0000 | ORAL_TABLET | Freq: Every day | ORAL | Status: DC

## 2011-12-15 NOTE — Patient Instructions (Signed)
We have sent the following medications to your pharmacy for you to pick up at your convenience:Lomotil and Entocort. cc: Eleonore Chiquito, MD

## 2011-12-15 NOTE — Progress Notes (Signed)
History of Present Illness: This is an 78 year old male here today with his daughter. He has lymphocytic colitis and has been unable to come off Entocort and even a slight tapering the dose leads to recurrent diarrhea. Recently he has required the addition of Lomotil which has been effective in controlling his diarrhea he has no other gastrointestinal complaints.  Current Medications, Allergies, Past Medical History, Past Surgical History, Family History and Social History were reviewed in [employer] record.  Physical Exam: General: Well developed, thin, elderly, no acute distress Head: Normocephalic and atraumatic Eyes:  sclerae anicteric, EOMI Ears: Normal auditory acuity Mouth: No deformity or lesions Lungs: Clear throughout to auscultation Heart: Regular rate and rhythm; no murmurs, rubs or bruits Abdomen: Soft, non tender and non distended. No masses, hepatosplenomegaly or hernias noted. Normal Bowel sounds Musculoskeletal: Symmetrical with no gross deformities  Pulses:  Normal pulses noted Extremities: No clubbing, cyanosis, edema or deformities noted Neurological: Alert oriented x 4, grossly nonfocal Psychological:  Alert and cooperative. Normal mood and affect  Assessment and Recommendations:  1. Lymphocytic colitis controlled with Entocort and Lomotil. Continue current medications.

## 2012-01-19 ENCOUNTER — Other Ambulatory Visit: Payer: Self-pay | Admitting: Internal Medicine

## 2012-02-03 ENCOUNTER — Ambulatory Visit (INDEPENDENT_AMBULATORY_CARE_PROVIDER_SITE_OTHER): Payer: Federal, State, Local not specified - PPO | Admitting: Internal Medicine

## 2012-02-03 ENCOUNTER — Encounter: Payer: Self-pay | Admitting: Internal Medicine

## 2012-02-03 VITALS — BP 110/60 | Temp 98.1°F | Wt 158.0 lb

## 2012-02-03 DIAGNOSIS — I509 Heart failure, unspecified: Secondary | ICD-10-CM

## 2012-02-03 DIAGNOSIS — G3184 Mild cognitive impairment, so stated: Secondary | ICD-10-CM

## 2012-02-03 MED ORDER — FUROSEMIDE 20 MG PO TABS: 20.0000 mg | ORAL_TABLET | ORAL | Status: DC | PRN

## 2012-02-03 NOTE — Progress Notes (Signed)
  Subjective:    Patient ID: [name], male    DOB: June 27, 1922, 78 y.o.   MRN: [id number]  HPI  Wt Readings from Last 3 Encounters:  02/03/12 158 lb (71.668 kg)  12/15/11 148 lb (67.132 kg)  12/01/11 151 lb (68.493 kg)    Review of Systems     Objective:   Physical Exam        Assessment & Plan:

## 2012-02-03 NOTE — Progress Notes (Signed)
  Subjective:    Patient ID: [name], male    DOB: 1922/04/06, 78 y.o.   MRN: [id number]  HPI  77 year old patient who is seen today for followup of congestive heart failure complicated by hypotension. He was seen here 6 weeks ago and was on furosemide 20 mg Monday Wednesday Friday. Blood pressure was low and he remained weak and orthostatic. He has been taken no Lasix and has had slightly worsening of his peripheral edema but a 10 pound weight gain over the past 6 weeks. Denies any other symptoms of heart failure. He feels much improved    Review of Systems  Constitutional: Positive for unexpected weight change. Negative for fever, chills, appetite change and fatigue.  HENT: Negative for hearing loss, ear pain, congestion, sore throat, trouble swallowing, neck stiffness, dental problem, voice change and tinnitus.   Eyes: Negative for pain, discharge and visual disturbance.  Respiratory: Negative for cough, chest tightness, wheezing and stridor.   Cardiovascular: Positive for leg swelling. Negative for chest pain and palpitations.  Gastrointestinal: Negative for nausea, vomiting, abdominal pain, diarrhea, constipation, blood in stool and abdominal distention.  Genitourinary: Negative for urgency, hematuria, flank pain, discharge, difficulty urinating and genital sores.  Musculoskeletal: Negative for myalgias, back pain, joint swelling, arthralgias and gait problem.  Skin: Negative for rash.  Neurological: Positive for weakness. Negative for dizziness, syncope, speech difficulty, numbness and headaches.  Hematological: Negative for adenopathy. Does not bruise/bleed easily.  Psychiatric/Behavioral: Negative for behavioral problems and dysphoric mood. The patient is not nervous/anxious.        Objective:   Physical Exam  Constitutional: He is oriented to person, place, and time. He appears well-developed.       Clinically looks good. Blood pressure 120/70  HENT:  Head: Normocephalic.    Right Ear: External ear normal.  Left Ear: External ear normal.  Eyes: Conjunctivae and EOM are normal.  Neck: Normal range of motion.  Cardiovascular: Normal rate and normal heart sounds.   Pulmonary/Chest: He has rales.  Abdominal: Bowel sounds are normal.  Musculoskeletal: Normal range of motion. He exhibits edema. He exhibits no tenderness.  Neurological: He is alert and oriented to person, place, and time.  Psychiatric: He has a normal mood and affect. His behavior is normal.          Assessment & Plan:   Congestive heart failure. Patient has had 10 pounds of weight gain over the past 6 weeks with slight worsening of peripheral edema. Clinically he has done much better with resolution of hypotension. We'll continue a low-salt diet and resume furosemide 20 mg each Monday and Thursday only. Will continue home on return of daily weights. They will report any significant weight gain. Otherwise we'll recheck in 2 months

## 2012-02-03 NOTE — Patient Instructions (Signed)
Limit your sodium (Salt) intake   

## 2012-02-04 ENCOUNTER — Other Ambulatory Visit: Payer: Self-pay

## 2012-02-04 MED ORDER — FUROSEMIDE 20 MG PO TABS: ORAL_TABLET | ORAL | Status: DC

## 2012-03-10 ENCOUNTER — Other Ambulatory Visit: Payer: Self-pay | Admitting: Dermatology

## 2012-04-04 ENCOUNTER — Ambulatory Visit (INDEPENDENT_AMBULATORY_CARE_PROVIDER_SITE_OTHER): Payer: Federal, State, Local not specified - PPO | Admitting: Internal Medicine

## 2012-04-04 ENCOUNTER — Encounter: Payer: Self-pay | Admitting: Internal Medicine

## 2012-04-04 VITALS — BP 80/50 | Wt 154.0 lb

## 2012-04-04 DIAGNOSIS — F488 Other specified nonpsychotic mental disorders: Secondary | ICD-10-CM

## 2012-04-04 DIAGNOSIS — G3184 Mild cognitive impairment, so stated: Secondary | ICD-10-CM

## 2012-04-04 DIAGNOSIS — I509 Heart failure, unspecified: Secondary | ICD-10-CM

## 2012-04-04 NOTE — Progress Notes (Signed)
  Subjective:    Patient ID: [name], male    DOB: [birth year], 78 y.o.   MRN: [id number]  HPI  78 year old patient who is seen today for followup. He has a history congestive heart failure but has been intolerant of medications due to hypotension. His status has been fairly stable. He has some chronic lower extremity edema that has been managed with Lasix 20 mg twice weekly each Monday and Thursday. No shortness of breath his weight is down 4 pounds compared to his last visit here. He has a history of mild dementia and cerebrovascular disease.  Review of Systems  Constitutional: Negative for fever, chills, appetite change and fatigue.  HENT: Negative for hearing loss, ear pain, congestion, sore throat, trouble swallowing, neck stiffness, dental problem, voice change and tinnitus.   Eyes: Negative for pain, discharge and visual disturbance.  Respiratory: Negative for cough, chest tightness, wheezing and stridor.   Cardiovascular: Positive for leg swelling. Negative for chest pain and palpitations.  Gastrointestinal: Negative for nausea, vomiting, abdominal pain, diarrhea, constipation, blood in stool and abdominal distention.  Genitourinary: Negative for urgency, hematuria, flank pain, discharge, difficulty urinating and genital sores.  Musculoskeletal: Negative for myalgias, back pain, joint swelling, arthralgias and gait problem.  Skin: Negative for rash.  Neurological: Positive for weakness. Negative for dizziness, syncope, speech difficulty, numbness and headaches.  Hematological: Negative for adenopathy. Does not bruise/bleed easily.  Psychiatric/Behavioral: Negative for behavioral problems and dysphoric mood. The patient is not nervous/anxious.        Objective:   Physical Exam  Constitutional: He is oriented to person, place, and time. He appears well-developed.       80/60 initial;  incr to 100/60  HENT:  Head: Normocephalic.  Right Ear: External ear normal.  Left Ear:  External ear normal.  Eyes: Conjunctivae and EOM are normal.  Neck: Normal range of motion.  Cardiovascular: Normal rate and normal heart sounds.   Pulmonary/Chest: He has rales.  Abdominal: Bowel sounds are normal.  Musculoskeletal: Normal range of motion. He exhibits edema. He exhibits no tenderness.  Neurological: He is alert and oriented to person, place, and time.  Psychiatric: He has a normal mood and affect. His behavior is normal.          Assessment & Plan:   Congestive heart failure. Weight down 4 pounds. Seems fairly well compensated. We'll continue present regimen due to hypotension Mild cognitive impairment  Recheck 3 months or as needed

## 2012-04-04 NOTE — Patient Instructions (Signed)
Limit your sodium (Salt) intake  Return in 3 months for follow-up   

## 2012-04-26 ENCOUNTER — Other Ambulatory Visit: Payer: Self-pay | Admitting: Internal Medicine

## 2012-05-22 ENCOUNTER — Telehealth: Payer: Self-pay | Admitting: Internal Medicine

## 2012-05-22 NOTE — Telephone Encounter (Signed)
Spoke with wife - will see tomorrow per dr. Amador Cunas

## 2012-05-22 NOTE — Telephone Encounter (Signed)
Please advise.  Chest X-ray or/and an appointment?

## 2012-05-22 NOTE — Telephone Encounter (Signed)
ROV 05-23-12

## 2012-05-22 NOTE — Telephone Encounter (Signed)
Caller: Nicholos Johns (daughter-In-:Law/Other; Patient Name: [name]; PCP: [facility]on[facility]); Best Callback Phone Number: [phone]; Reason for call: Severe choking episode on 05/21/12. Patient went limp and had to have Heimlich maneuver performed. Patient reports that he feels like "my ribs are cracked" and is in alot of pain. Patient has been able to take himself to the bathroom through the night and began having pain when he sat up this morning. Emergent symptom of "Known diabetes, stroke, Parkinson's, scleroderma, muscular dystrophy, myasthenia gravis OR other motor disorder and worsening symptoms" positive pepr Swallowing Difficulty guideline. Disposition: See Provider within 24 hours. Unable to schedule appointment at this time. No appointment slots are available for Dr. Amador Cunas today 05/22/12. PLEASE CALL PATIENT BACK AND LET THEM KNOW WHEN AN APPOINTMENT CAN BE WORKED IN. Thanks.

## 2012-05-23 ENCOUNTER — Encounter: Payer: Self-pay | Admitting: Internal Medicine

## 2012-05-23 ENCOUNTER — Ambulatory Visit (INDEPENDENT_AMBULATORY_CARE_PROVIDER_SITE_OTHER): Payer: Federal, State, Local not specified - PPO | Admitting: Internal Medicine

## 2012-05-23 VITALS — BP 100/60 | Temp 97.7°F | Wt 152.0 lb

## 2012-05-23 DIAGNOSIS — I509 Heart failure, unspecified: Secondary | ICD-10-CM

## 2012-05-23 DIAGNOSIS — I10 Essential (primary) hypertension: Secondary | ICD-10-CM

## 2012-05-23 DIAGNOSIS — G3184 Mild cognitive impairment, so stated: Secondary | ICD-10-CM

## 2012-05-23 DIAGNOSIS — R079 Chest pain, unspecified: Secondary | ICD-10-CM

## 2012-05-23 DIAGNOSIS — F488 Other specified nonpsychotic mental disorders: Secondary | ICD-10-CM

## 2012-05-23 MED ORDER — HYDROCODONE-ACETAMINOPHEN 5-500 MG PO TABS: 60.0000 | ORAL_TABLET | Freq: Three times a day (TID) | ORAL | Status: AC | PRN

## 2012-05-23 NOTE — Progress Notes (Signed)
Subjective:    Patient ID: [name], male    DOB: [birth year], 78 y.o.   MRN: [id number]  HPI  78 year old patient who is seen today for followup. Medical problems include congestive heart failure hypertension and general debility. 2 days ago the patient had a choking episode that required a Heimlich maneuver to dislodge food material and secretions. He has been quite uncomfortable especially yesterday but today continues to have some left-sided chest pain. In general he has been fairly stable.  Past Medical History  Diagnosis Date  . DEPRESSION 04/03/2007  . Diarrhea 06/20/2008  . DIVERTICULITIS, HX OF 04/03/2007  . GERD 08/31/2007  . HYPERTENSION 04/03/2007  . MILD COGNITIVE IMPAIRMENT SO STATED 08/01/2008  . NEPHROLITHIASIS, HX OF 04/03/2007  . Neurasthenia 03/10/2009  . OSTEOPOROSIS 08/31/2007  . PROSTATE CANCER, HX OF 04/03/2007  . RADIATION PROCTITIS 01/14/2010  . SKIN CANCER, HX OF 04/03/2007    Had some removed   . TRANSIENT ISCHEMIC ATTACK 08/14/2009  . Lymphocytic colitis   . History of colon cancer 1960  . CHF (congestive heart failure)     History   Social History  . Marital Status: Married    Spouse Name: N/A    Number of Children: N/A  . Years of Education: N/A   Occupational History  . Retired    Social History Main Topics  . Smoking status: Former Smoker    Quit date: 04/07/1979  . Smokeless tobacco: Never Used  . Alcohol Use: No  . Drug Use: No  . Sexually Active: Not on file   Other Topics Concern  . Not on file   Social History Narrative  . No narrative on file    Past Surgical History  Procedure Date  . Hemicolectomy 1960's  . Lumbar laminectomy 2006  . Prostate biopsy 1999  . Empyema drainage 1934  . Inguinal hernia repair 5621,3086  . Eye surgery 2009    Both eyes  . Rib resection   . Cataract extraction, bilateral 11/2008    Family History  Problem Relation Age of Onset  . Heart attack Father 66  . Liver cancer Mother 43  . Colon cancer  Neg Hx     No Known Allergies  Current Outpatient Prescriptions on File Prior to Visit  Medication Sig Dispense Refill  . AMBULATORY NON FORMULARY MEDICATION Pressure Vision/ Lutein- One tablet by mouth twice daily      . budesonide (ENTOCORT EC) 3 MG 24 hr capsule Take 3 capsules (9 mg total) by mouth every morning. Take 9 mg by mouth every morning.  90 capsule  11  . clopidogrel (PLAVIX) 75 MG tablet Take 75 mg by mouth daily.        . diphenoxylate-atropine (LOMOTIL) 2.5-0.025 MG per tablet Take 1 tablet by mouth daily. For diarrhea  30 tablet  11  . escitalopram (LEXAPRO) 20 MG tablet Take 20 mg by mouth daily.        . EXELON 9.5 MG/24HR PLACE 1 PATCH ONTO THE SKIN DAILY  90 each  1  . famotidine (PEPCID) 20 MG tablet Take 20 mg by mouth at bedtime.        . furosemide (LASIX) 20 MG tablet Take 1 po qd on Monday and Thursday - and prn on other days  90 tablet  4  . memantine (NAMENDA) 10 MG tablet Take 1 tablet (10 mg total) by mouth 2 (two) times daily.  180 tablet  6  . Omega-3 Fatty Acids (FISH OIL) 1000  MG CAPS Take 2 capsules by mouth daily.        . Probiotic Product (ALIGN) 4 MG CAPS Take 1 tablet by mouth daily. Daily       . rivastigmine (EXELON) 9.5 mg/24hr Place 1 patch onto the skin daily.        . vitamin B-12 (CYANOCOBALAMIN) 500 MCG tablet Take 500 mcg by mouth daily.      Marland Kitchen DISCONTD: furosemide (LASIX) 20 MG tablet Take 1 tablet (20 mg total) by mouth daily.  90 tablet  11    BP 100/60  Temp 97.7 F (36.5 C) (Oral)  Wt 152 lb (68.947 kg)      Review of Systems  Constitutional: Negative for fever, chills, appetite change and fatigue.  HENT: Negative for hearing loss, ear pain, congestion, sore throat, trouble swallowing, neck stiffness, dental problem, voice change and tinnitus.   Eyes: Negative for pain, discharge and visual disturbance.  Respiratory: Negative for cough, chest tightness, wheezing and stridor.   Cardiovascular: Positive for chest pain.  Negative for palpitations and leg swelling.  Gastrointestinal: Negative for nausea, vomiting, abdominal pain, diarrhea, constipation, blood in stool and abdominal distention.  Genitourinary: Negative for urgency, hematuria, flank pain, discharge, difficulty urinating and genital sores.  Musculoskeletal: Negative for myalgias, back pain, joint swelling, arthralgias and gait problem.  Skin: Positive for rash and wound.  Neurological: Negative for dizziness, syncope, speech difficulty, weakness, numbness and headaches.  Hematological: Negative for adenopathy. Does not bruise/bleed easily.  Psychiatric/Behavioral: Negative for behavioral problems and dysphoric mood. The patient is not nervous/anxious.        Objective:   Physical Exam  Constitutional: He is oriented to person, place, and time. He appears well-developed.       Alert week no distress blood pressure low normal range. Weight 152  HENT:  Head: Normocephalic.  Right Ear: External ear normal.  Left Ear: External ear normal.  Eyes: Conjunctivae and EOM are normal.  Neck: Normal range of motion.  Cardiovascular: Normal rate and normal heart sounds.   Pulmonary/Chest: Breath sounds normal.       Considerable tenderness gentle palpation left anterior and posterior chest wall  Bibasilar crackles  Abdominal: Bowel sounds are normal.  Musculoskeletal: Normal range of motion. He exhibits edema. He exhibits no tenderness.       Soft tissue swelling excessive warmth and tenderness right knee  Neurological: He is alert and oriented to person, place, and time.  Skin:       Multiple ecchymoses and skin tears over all extremities  Psychiatric: He has a normal mood and affect. His behavior is normal.          Assessment & Plan:    Chest wall pain secondary trauma CHF HTN Multiple ecchymoses

## 2012-05-23 NOTE — Patient Instructions (Signed)
Take Aleve 200 mg twice daily for pain or swelling  Take [phone]  mg of Tylenol every 6 hours as needed for pain relief or fever.  Avoid taking more than 3000 mg in a 24-hour period (  This may cause liver damage).

## 2012-05-31 ENCOUNTER — Other Ambulatory Visit: Payer: Self-pay | Admitting: Internal Medicine

## 2012-06-06 ENCOUNTER — Other Ambulatory Visit: Payer: Self-pay | Admitting: Gastroenterology

## 2012-07-04 ENCOUNTER — Other Ambulatory Visit: Payer: Self-pay | Admitting: Gastroenterology

## 2012-07-07 ENCOUNTER — Encounter: Payer: Self-pay | Admitting: Internal Medicine

## 2012-07-07 ENCOUNTER — Ambulatory Visit (INDEPENDENT_AMBULATORY_CARE_PROVIDER_SITE_OTHER): Payer: Federal, State, Local not specified - PPO | Admitting: Internal Medicine

## 2012-07-07 VITALS — BP 74/48 | Temp 97.8°F | Wt 152.0 lb

## 2012-07-07 DIAGNOSIS — G20 Parkinson's disease: Secondary | ICD-10-CM

## 2012-07-07 DIAGNOSIS — I509 Heart failure, unspecified: Secondary | ICD-10-CM

## 2012-07-07 DIAGNOSIS — Z23 Encounter for immunization: Secondary | ICD-10-CM

## 2012-07-07 DIAGNOSIS — G20A1 Parkinson's disease without dyskinesia, without mention of fluctuations: Secondary | ICD-10-CM

## 2012-07-07 DIAGNOSIS — I10 Essential (primary) hypertension: Secondary | ICD-10-CM

## 2012-07-07 DIAGNOSIS — G3184 Mild cognitive impairment, so stated: Secondary | ICD-10-CM

## 2012-07-07 LAB — CBC WITH DIFFERENTIAL/PLATELET
Basophils Relative: 0.9 % (ref 0.0–3.0)
Eosinophils Relative: 2.9 % (ref 0.0–5.0)
HCT: 37.3 % — ABNORMAL LOW (ref 39.0–52.0)
Lymphs Abs: 1.4 10*3/uL (ref 0.7–4.0)
MCV: 100.7 fl — ABNORMAL HIGH (ref 78.0–100.0)
Monocytes Absolute: 0.8 10*3/uL (ref 0.1–1.0)
Monocytes Relative: 10.4 % (ref 3.0–12.0)
Platelets: 218 10*3/uL (ref 150.0–400.0)
RBC: 3.7 Mil/uL — ABNORMAL LOW (ref 4.22–5.81)
WBC: 7.4 10*3/uL (ref 4.5–10.5)

## 2012-07-07 LAB — COMPREHENSIVE METABOLIC PANEL
Alkaline Phosphatase: 97 U/L (ref 39–117)
BUN: 28 mg/dL — ABNORMAL HIGH (ref 6–23)
CO2: 30 mEq/L (ref 19–32)
Creatinine, Ser: 1.2 mg/dL (ref 0.4–1.5)
GFR: 63.71 mL/min (ref >60.00)
Glucose, Bld: 92 mg/dL (ref 70–99)
Total Bilirubin: 1.2 mg/dL (ref 0.3–1.2)
Total Protein: 6.4 g/dL (ref 6.0–8.3)

## 2012-07-07 LAB — BRAIN NATRIURETIC PEPTIDE: Pro B Natriuretic peptide (BNP): 240 pg/mL — ABNORMAL HIGH (ref 0.0–100.0)

## 2012-07-07 NOTE — Patient Instructions (Signed)
Limit your sodium (Salt) intake  Return in 2 months for followup or as needed

## 2012-07-07 NOTE — Progress Notes (Signed)
Subjective:    Patient ID: [name], male    DOB: 1921-11-09, 78 y.o.   MRN: [id number]  HPI  78 year old patient who is seen today for followup. He has a history of mild dementia and congestive heart failure. He remains weak with considerable daytime sleepiness. Parkinsonian symptoms include occasional tremor gait instability and episodes of freezing. There has been no peripheral edema. He is on furosemide 20 mg twice per week. Blood pressure remains low and he has been unable to tolerate more aggressive treatment. Denies much in the way of shortness of breath.  Past Medical History  Diagnosis Date  . DEPRESSION 04/03/2007  . Diarrhea 06/20/2008  . DIVERTICULITIS, HX OF 04/03/2007  . GERD 08/31/2007  . HYPERTENSION 04/03/2007  . MILD COGNITIVE IMPAIRMENT SO STATED 08/01/2008  . NEPHROLITHIASIS, HX OF 04/03/2007  . Neurasthenia 03/10/2009  . OSTEOPOROSIS 08/31/2007  . PROSTATE CANCER, HX OF 04/03/2007  . RADIATION PROCTITIS 01/14/2010  . SKIN CANCER, HX OF 04/03/2007    Had some removed   . TRANSIENT ISCHEMIC ATTACK 08/14/2009  . Lymphocytic colitis   . History of colon cancer 1960  . CHF (congestive heart failure)     History   Social History  . Marital Status: Married    Spouse Name: N/A    Number of Children: N/A  . Years of Education: N/A   Occupational History  . Retired    Social History Main Topics  . Smoking status: Former Smoker    Quit date: 04/07/1979  . Smokeless tobacco: Never Used  . Alcohol Use: No  . Drug Use: No  . Sexually Active: Not on file   Other Topics Concern  . Not on file   Social History Narrative  . No narrative on file    Past Surgical History  Procedure Date  . Hemicolectomy 1960's  . Lumbar laminectomy 2006  . Prostate biopsy 1999  . Empyema drainage 1934  . Inguinal hernia repair 4098,1191  . Eye surgery 2009    Both eyes  . Rib resection   . Cataract extraction, bilateral 11/2008    Family History  Problem Relation Age of Onset    . Heart attack Father 84  . Liver cancer Mother 55  . Colon cancer Neg Hx     No Known Allergies  Current Outpatient Prescriptions on File Prior to Visit  Medication Sig Dispense Refill  . AMBULATORY NON FORMULARY MEDICATION Pressure Vision/ Lutein- One tablet by mouth twice daily      . budesonide (ENTOCORT EC) 3 MG 24 hr capsule Take 3 capsules (9 mg total) by mouth every morning. Take 9 mg by mouth every morning.  90 capsule  11  . clopidogrel (PLAVIX) 75 MG tablet Take 75 mg by mouth daily.        . clopidogrel (PLAVIX) 75 MG tablet TAKE 1 TABLET BY MOUTH EVERY DAY  90 tablet  3  . diphenoxylate-atropine (LOMOTIL) 2.5-0.025 MG per tablet TAKE 1 TABLET BY MOUTH EVERY DAY FOR DIARRHEA  30 tablet  0  . escitalopram (LEXAPRO) 20 MG tablet Take 20 mg by mouth daily.        . EXELON 9.5 MG/24HR PLACE 1 PATCH ONTO THE SKIN DAILY  90 each  1  . famotidine (PEPCID) 20 MG tablet Take 20 mg by mouth at bedtime.        . furosemide (LASIX) 20 MG tablet Take 1 po qd on Monday and Thursday - and prn on other days  90  tablet  4  . Omega-3 Fatty Acids (FISH OIL) 1000 MG CAPS Take 2 capsules by mouth daily.        . Probiotic Product (ALIGN) 4 MG CAPS Take 1 tablet by mouth daily. Daily       . rivastigmine (EXELON) 9.5 mg/24hr Place 1 patch onto the skin daily.        . vitamin B-12 (CYANOCOBALAMIN) 500 MCG tablet Take 500 mcg by mouth daily.      Marland Kitchen DISCONTD: memantine (NAMENDA) 10 MG tablet Take 1 tablet (10 mg total) by mouth 2 (two) times daily.  180 tablet  6  . DISCONTD: furosemide (LASIX) 20 MG tablet Take 1 tablet (20 mg total) by mouth daily.  90 tablet  11    BP 74/48  Temp 97.8 F (36.6 C) (Oral)  Wt 152 lb (68.947 kg)     Wt Readings from Last 3 Encounters:  07/07/12 152 lb (68.947 kg)  05/23/12 152 lb (68.947 kg)  04/04/12 154 lb (69.854 kg)    Review of Systems  Constitutional: Positive for fatigue. Negative for fever, chills and appetite change.  HENT: Negative for  hearing loss, ear pain, congestion, sore throat, trouble swallowing, neck stiffness, dental problem, voice change and tinnitus.   Eyes: Negative for pain, discharge and visual disturbance.  Respiratory: Negative for cough, chest tightness, wheezing and stridor.   Cardiovascular: Negative for chest pain, palpitations and leg swelling.  Gastrointestinal: Negative for nausea, vomiting, abdominal pain, diarrhea, constipation, blood in stool and abdominal distention.  Genitourinary: Negative for urgency, hematuria, flank pain, discharge, difficulty urinating and genital sores.  Musculoskeletal: Positive for gait problem. Negative for myalgias, back pain, joint swelling and arthralgias.  Skin: Negative for rash.  Neurological: Positive for tremors and weakness. Negative for dizziness, syncope, speech difficulty, numbness and headaches.  Hematological: Negative for adenopathy. Does not bruise/bleed easily.  Psychiatric/Behavioral: Negative for behavioral problems and dysphoric mood. The patient is not nervous/anxious.        Objective:   Physical Exam  Constitutional: He appears well-developed and well-nourished.       Weak but appropriate Blood pressure 92 over 54  HENT:  Head: Normocephalic and atraumatic.  Right Ear: External ear normal.  Left Ear: External ear normal.  Nose: Nose normal.  Mouth/Throat: Oropharynx is clear and moist.  Eyes: Conjunctivae normal and EOM are normal. Pupils are equal, round, and reactive to light. No scleral icterus.  Neck: Normal range of motion. Neck supple. No JVD present. No thyromegaly present.  Cardiovascular: Normal heart sounds and intact distal pulses.  Exam reveals no gallop and no friction rub.   No murmur heard.      Irregular  Pulmonary/Chest: Effort normal. He exhibits no tenderness.       Considerable rales right greater than left O2 saturation 92% Pulse rate 75  Abdominal: Soft. Bowel sounds are normal. He exhibits no distension and no mass.  There is no tenderness.  Genitourinary: Prostate normal and penis normal.  Musculoskeletal: Normal range of motion. He exhibits no edema and no tenderness.  Lymphadenopathy:    He has no cervical adenopathy.  Neurological: He is alert. He has normal reflexes. No cranial nerve deficit. Coordination normal.  Skin: Skin is warm and dry. No rash noted.  Psychiatric: He has a normal mood and affect. His behavior is normal.          Assessment & Plan:   Congestive heart failure. History of hypertension presently hypotensive Dementia with parkinsonian features  Medical regimen unchanged Lab update Recheck 2 months

## 2012-08-09 ENCOUNTER — Encounter: Payer: Self-pay | Admitting: Internal Medicine

## 2012-08-09 ENCOUNTER — Ambulatory Visit (INDEPENDENT_AMBULATORY_CARE_PROVIDER_SITE_OTHER): Payer: Federal, State, Local not specified - PPO | Admitting: Internal Medicine

## 2012-08-09 ENCOUNTER — Other Ambulatory Visit: Payer: Self-pay

## 2012-08-09 DIAGNOSIS — G20A1 Parkinson's disease without dyskinesia, without mention of fluctuations: Secondary | ICD-10-CM

## 2012-08-09 DIAGNOSIS — I509 Heart failure, unspecified: Secondary | ICD-10-CM

## 2012-08-09 DIAGNOSIS — G3184 Mild cognitive impairment, so stated: Secondary | ICD-10-CM

## 2012-08-09 DIAGNOSIS — G20 Parkinson's disease: Secondary | ICD-10-CM

## 2012-08-09 MED ORDER — DIPHENOXYLATE-ATROPINE 2.5-0.025 MG PO TABS: ORAL_TABLET | ORAL | Status: DC

## 2012-08-09 NOTE — Progress Notes (Signed)
  Subjective:    Patient ID: [name], male    DOB: [birth year], 78 y.o.   MRN: [id number]  HPI  a family conference was held with one of the patient's sons and daughter in law. The patient has congestive heart failure with hypotension. His status is about the same but he remains quite weak with considerable daytime hypersomnolence. He's had frequent falls with a walker. He's had some Parkinsonian features with gait instability bradykinesia and episodes of "freezing"with ambulation. There is some concern about hypotension associated symptoms as a cause of falls and episodes of freezing. During these episodes he appears to have diminished level of consciousness and there's been some concern that about a possible seizure disorder perhaps triggered by hypoperfusion. Cardiology and/or neurology referral as discussed and they will consider. The poor prognosis of heart failure discussed. Hospice services also discussed    Review of Systems     Objective:   Physical Exam        Assessment & Plan:

## 2012-08-11 ENCOUNTER — Telehealth: Payer: Self-pay | Admitting: Internal Medicine

## 2012-08-11 NOTE — Telephone Encounter (Signed)
Kayse Losee would like to talk to Dr Kirtland Bouchard once more about the pt, his father. Please call him on his cell phone: [phone] at your earliest convenience. Thank you.

## 2012-08-21 ENCOUNTER — Other Ambulatory Visit: Payer: Self-pay | Admitting: Internal Medicine

## 2012-09-04 ENCOUNTER — Other Ambulatory Visit: Payer: Self-pay

## 2012-09-04 MED ORDER — DIPHENOXYLATE-ATROPINE 2.5-0.025 MG PO TABS: ORAL_TABLET | ORAL | Status: DC

## 2012-09-07 ENCOUNTER — Ambulatory Visit (INDEPENDENT_AMBULATORY_CARE_PROVIDER_SITE_OTHER): Payer: Federal, State, Local not specified - PPO | Admitting: Internal Medicine

## 2012-09-07 ENCOUNTER — Encounter: Payer: Self-pay | Admitting: Internal Medicine

## 2012-09-07 VITALS — BP 92/50 | HR 74 | Temp 97.4°F | Resp 16 | Wt 152.0 lb

## 2012-09-07 DIAGNOSIS — F3289 Other specified depressive episodes: Secondary | ICD-10-CM

## 2012-09-07 DIAGNOSIS — I509 Heart failure, unspecified: Secondary | ICD-10-CM

## 2012-09-07 DIAGNOSIS — F329 Major depressive disorder, single episode, unspecified: Secondary | ICD-10-CM

## 2012-09-07 DIAGNOSIS — F488 Other specified nonpsychotic mental disorders: Secondary | ICD-10-CM

## 2012-09-07 DIAGNOSIS — G3184 Mild cognitive impairment, so stated: Secondary | ICD-10-CM

## 2012-09-07 NOTE — Patient Instructions (Signed)
Limit your sodium (Salt) intake  Return in 3 months for follow-up   

## 2012-09-07 NOTE — Progress Notes (Signed)
Subjective:    Patient ID: [name], male    DOB: March 25, 1922, 78 y.o.   MRN: [id number]  HPI 78 year old patient who is seen today in followup. He has a history of congestive heart failure which has been remarkably stable. More recently he has a clinically improved with much more alertness and less so weakness and more social interaction. He's had several family members  visit . Denies any shortness of breath. Weight has been stable. There's been no shortness of breath or peripheral edema. Laboratory studies were done 2 months ago.  Past Medical History  Diagnosis Date  . DEPRESSION 04/03/2007  . Diarrhea 06/20/2008  . DIVERTICULITIS, HX OF 04/03/2007  . GERD 08/31/2007  . HYPERTENSION 04/03/2007  . MILD COGNITIVE IMPAIRMENT SO STATED 08/01/2008  . NEPHROLITHIASIS, HX OF 04/03/2007  . Neurasthenia 03/10/2009  . OSTEOPOROSIS 08/31/2007  . PROSTATE CANCER, HX OF 04/03/2007  . RADIATION PROCTITIS 01/14/2010  . SKIN CANCER, HX OF 04/03/2007    Had some removed   . TRANSIENT ISCHEMIC ATTACK 08/14/2009  . Lymphocytic colitis   . History of colon cancer 1960  . CHF (congestive heart failure)     History   Social History  . Marital Status: Married    Spouse Name: N/A    Number of Children: N/A  . Years of Education: N/A   Occupational History  . Retired    Social History Main Topics  . Smoking status: Former Smoker    Quit date: 04/07/1979  . Smokeless tobacco: Never Used  . Alcohol Use: No  . Drug Use: No  . Sexually Active: Not on file   Other Topics Concern  . Not on file   Social History Narrative  . No narrative on file    Past Surgical History  Procedure Date  . Hemicolectomy 1960's  . Lumbar laminectomy 2006  . Prostate biopsy 1999  . Empyema drainage 1934  . Inguinal hernia repair 4098,1191  . Eye surgery 2009    Both eyes  . Rib resection   . Cataract extraction, bilateral 11/2008    Family History  Problem Relation Age of Onset  . Heart attack Father 45  .  Liver cancer Mother 84  . Colon cancer Neg Hx     No Known Allergies  Current Outpatient Prescriptions on File Prior to Visit  Medication Sig Dispense Refill  . AMBULATORY NON FORMULARY MEDICATION Pressure Vision/ Lutein- One tablet by mouth twice daily      . budesonide (ENTOCORT EC) 3 MG 24 hr capsule Take 3 capsules (9 mg total) by mouth every morning. Take 9 mg by mouth every morning.  90 capsule  11  . clopidogrel (PLAVIX) 75 MG tablet Take 75 mg by mouth daily.        . clopidogrel (PLAVIX) 75 MG tablet TAKE 1 TABLET BY MOUTH EVERY DAY  90 tablet  3  . diphenoxylate-atropine (LOMOTIL) 2.5-0.025 MG per tablet TAKE 1 TABLET BY MOUTH EVERY DAY FOR DIARRHEA  30 tablet  1  . escitalopram (LEXAPRO) 20 MG tablet Take 20 mg by mouth daily.        Marland Kitchen escitalopram (LEXAPRO) 20 MG tablet TAKE 1 TABLET BY MOUTH EVERY DAY  90 tablet  0  . EXELON 9.5 MG/24HR PLACE 1 PATCH ONTO THE SKIN DAILY  90 each  1  . famotidine (PEPCID) 20 MG tablet Take 20 mg by mouth at bedtime.        . furosemide (LASIX) 20 MG tablet Take  1 po qd on Monday and Thursday - and prn on other days  90 tablet  4  . memantine (NAMENDA) 10 MG tablet Take 10 mg by mouth 2 (two) times daily.      . Omega-3 Fatty Acids (FISH OIL) 1000 MG CAPS Take 2 capsules by mouth daily.        . Probiotic Product (ALIGN) 4 MG CAPS Take 1 tablet by mouth daily. Daily       . rivastigmine (EXELON) 9.5 mg/24hr Place 1 patch onto the skin daily.        . vitamin B-12 (CYANOCOBALAMIN) 500 MCG tablet Take 500 mcg by mouth daily.        BP 92/50  Pulse 74  Temp 97.4 F (36.3 C) (Oral)  Resp 16  Wt 152 lb (68.947 kg)  SpO2 91%     Wt Readings from Last 3 Encounters:  09/07/12 152 lb (68.947 kg)  07/07/12 152 lb (68.947 kg)  05/23/12 152 lb (68.947 kg)    Review of Systems  Constitutional: Positive for fatigue. Negative for fever, chills and appetite change.  HENT: Negative for hearing loss, ear pain, congestion, sore throat, trouble  swallowing, neck stiffness, dental problem, voice change and tinnitus.   Eyes: Negative for pain, discharge and visual disturbance.  Respiratory: Negative for cough, chest tightness, wheezing and stridor.   Cardiovascular: Negative for chest pain, palpitations and leg swelling.  Gastrointestinal: Negative for nausea, vomiting, abdominal pain, diarrhea, constipation, blood in stool and abdominal distention.  Genitourinary: Negative for urgency, hematuria, flank pain, discharge, difficulty urinating and genital sores.  Musculoskeletal: Negative for myalgias, back pain, joint swelling, arthralgias and gait problem.  Skin: Negative for rash.  Neurological: Positive for weakness. Negative for dizziness, syncope, speech difficulty, numbness and headaches.  Hematological: Negative for adenopathy. Does not bruise/bleed easily.  Psychiatric/Behavioral: Positive for confusion. Negative for behavioral problems and dysphoric mood. The patient is not nervous/anxious.        Objective:   Physical Exam  Constitutional: He is oriented to person, place, and time. He appears well-developed.       Weight 152 in stable Blood pressure as high as 110/60  HENT:  Head: Normocephalic.  Right Ear: External ear normal.  Left Ear: External ear normal.  Eyes: Conjunctivae normal and EOM are normal.  Neck: Normal range of motion.  Cardiovascular: Normal rate and normal heart sounds.   Pulmonary/Chest: He has rales.       Few crackles right base O2 saturation 94% pulse rate 55 and irregular   Abdominal: Bowel sounds are normal.  Musculoskeletal: Normal range of motion. He exhibits no edema and no tenderness.  Neurological: He is alert and oriented to person, place, and time.  Psychiatric: He has a normal mood and affect. His behavior is normal.          Assessment & Plan:  Congestive heart failure. Seems fairly well compensated Depression stable Cognitive impairment stable. Much brighter affect  today  We'll continue present regimen Recheck 3 months or as needed

## 2012-09-18 ENCOUNTER — Other Ambulatory Visit: Payer: Self-pay | Admitting: Internal Medicine

## 2012-11-03 ENCOUNTER — Other Ambulatory Visit: Payer: Self-pay

## 2012-11-03 MED ORDER — DIPHENOXYLATE-ATROPINE 2.5-0.025 MG PO TABS: ORAL_TABLET | ORAL | Status: DC

## 2012-11-22 ENCOUNTER — Other Ambulatory Visit: Payer: Self-pay | Admitting: Internal Medicine

## 2012-11-23 ENCOUNTER — Telehealth: Payer: Self-pay | Admitting: Internal Medicine

## 2012-11-23 NOTE — Telephone Encounter (Signed)
Patient Information:  Caller Name: Nicholos Johns  Phone: [phone]  Patient: [name], [name]  Gender: Male  DOB: January 16, 1922  Age: 79 Years  PCP: Eleonore Chiquito [facility])  Office Follow Up:  Does the office need to follow up with this patient?: Yes  Instructions For The Office: Requests office appt 11/23/12 instead of ED for open/draining leg wound. Please call back ASAP.  RN Note:  Wound smells bad when removes dressing. Purulent drainage present. Entire circumferance of wound is red with edema. Pencil eraser-sized black area inside wound.  Does not want to go to ED.  Strongly requests office visit instead. No appointments remain for 11/23/12,  Information noted and sent to [facility] pool for call back ASAP.   Symptoms  Reason For Call & Symptoms: Kathleen/ Daughter in law called about non-healing 2" X 3/4" open, draining wound on right shin.  Reviewed Health History In EMR: Yes  Reviewed Medications In EMR: Yes  Reviewed Allergies In EMR: Yes  Reviewed Surgeries / Procedures: Yes  Date of Onset of Symptoms: 11/12/2012  Treatments Tried: covering with vaseline and dressing, wound cleanser  Treatments Tried Worked: No  Guideline(s) Used:  Skin Injury  Wound Infection  Disposition Per Guideline:   Go to ED Now (or to Office with PCP Approval)  Reason For Disposition Reached:   Black (necrotic) or blisters develop in wound  Advice Given:  Call Back If:   Wound becomes more tender  Redness starts to spread  Pus, drainage, or fever occurs  You become worse  RN Overrode Recommendation:  Make Appointment  Office is open; requests appointment in office

## 2012-11-23 NOTE — Telephone Encounter (Signed)
Spoke with Nicholos Johns.  No appointment available for today.  ER refused.  Appointment made for tomorrow with Dr Kirtland Bouchard.  Explained if patient has fever, pain, vomiting then he should go to ER.  verbally agreement made.

## 2012-11-24 ENCOUNTER — Ambulatory Visit (INDEPENDENT_AMBULATORY_CARE_PROVIDER_SITE_OTHER): Payer: Federal, State, Local not specified - PPO | Admitting: Internal Medicine

## 2012-11-24 ENCOUNTER — Encounter: Payer: Self-pay | Admitting: Internal Medicine

## 2012-11-24 VITALS — BP 80/40 | HR 60 | Resp 16 | Wt 152.0 lb

## 2012-11-24 DIAGNOSIS — I509 Heart failure, unspecified: Secondary | ICD-10-CM

## 2012-11-24 DIAGNOSIS — G3184 Mild cognitive impairment, so stated: Secondary | ICD-10-CM

## 2012-11-24 DIAGNOSIS — F488 Other specified nonpsychotic mental disorders: Secondary | ICD-10-CM

## 2012-11-24 MED ORDER — FUROSEMIDE 20 MG PO TABS: 20.0000 mg | ORAL_TABLET | Freq: Every day | ORAL | Status: DC

## 2012-11-24 NOTE — Patient Instructions (Signed)
Limit your sodium (Salt) intake  Furosemide only if swelling worsens  Return in 3 months for follow-up

## 2012-11-24 NOTE — Progress Notes (Signed)
Subjective:    Patient ID: [name], male    DOB: 1922-03-04, 79 y.o.   MRN: [id number]  HPI 79 year old patient who is seen today in followup. Medical problems include dementia neurasthenia and congestive heart failure. The patient is seen today 2 weeks earlier than his routine followup do to a traumatic wound involving his right lower medial leg. This has been treated with local wound care and is slowly improving. His general status is about the same. He is on furosemide twice weekly due to heart failure and peripheral edema. This medication is limited by hypotension. He continues to have near syncopal episodes while standing in attempt to ambulate with a walker  Past Medical History  Diagnosis Date  . DEPRESSION 04/03/2007  . Diarrhea 06/20/2008  . DIVERTICULITIS, HX OF 04/03/2007  . GERD 08/31/2007  . HYPERTENSION 04/03/2007  . MILD COGNITIVE IMPAIRMENT SO STATED 08/01/2008  . NEPHROLITHIASIS, HX OF 04/03/2007  . Neurasthenia 03/10/2009  . OSTEOPOROSIS 08/31/2007  . PROSTATE CANCER, HX OF 04/03/2007  . RADIATION PROCTITIS 01/14/2010  . SKIN CANCER, HX OF 04/03/2007    Had some removed   . TRANSIENT ISCHEMIC ATTACK 08/14/2009  . Lymphocytic colitis   . History of colon cancer 1960  . CHF (congestive heart failure)     History   Social History  . Marital Status: Married    Spouse Name: N/A    Number of Children: N/A  . Years of Education: N/A   Occupational History  . Retired    Social History Main Topics  . Smoking status: Former Smoker    Quit date: 04/07/1979  . Smokeless tobacco: Never Used  . Alcohol Use: No  . Drug Use: No  . Sexually Active: Not on file   Other Topics Concern  . Not on file   Social History Narrative  . No narrative on file    Past Surgical History  Procedure Laterality Date  . Hemicolectomy  1960's  . Lumbar laminectomy  2006  . Prostate biopsy  1999  . Empyema drainage  1934  . Inguinal hernia repair  4098,1191  . Eye surgery  2009    Both  eyes  . Rib resection    . Cataract extraction, bilateral  11/2008    Family History  Problem Relation Age of Onset  . Heart attack Father 75  . Liver cancer Mother 13  . Colon cancer Neg Hx     No Known Allergies  Current Outpatient Prescriptions on File Prior to Visit  Medication Sig Dispense Refill  . AMBULATORY NON FORMULARY MEDICATION Pressure Vision/ Lutein- One tablet by mouth twice daily      . budesonide (ENTOCORT EC) 3 MG 24 hr capsule Take 3 capsules (9 mg total) by mouth every morning. Take 9 mg by mouth every morning.  90 capsule  11  . clopidogrel (PLAVIX) 75 MG tablet TAKE 1 TABLET BY MOUTH EVERY DAY  90 tablet  3  . diphenoxylate-atropine (LOMOTIL) 2.5-0.025 MG per tablet TAKE 1 TABLET BY MOUTH EVERY DAY FOR DIARRHEA  30 tablet  1  . escitalopram (LEXAPRO) 20 MG tablet TAKE 1 TABLET BY MOUTH EVERY DAY  90 tablet  1  . famotidine (PEPCID) 20 MG tablet Take 20 mg by mouth at bedtime.        . memantine (NAMENDA) 10 MG tablet Take 10 mg by mouth 2 (two) times daily.      Marland Kitchen NAMENDA 10 MG tablet TAKE 1 TABLET BY MOUTH TWICE DAILY  180 tablet  0  . Omega-3 Fatty Acids (FISH OIL) 1000 MG CAPS Take 2 capsules by mouth daily.        . rivastigmine (EXELON) 9.5 mg/24hr Place 1 patch onto the skin daily.        . vitamin B-12 (CYANOCOBALAMIN) 500 MCG tablet Take 500 mcg by mouth daily.       No current facility-administered medications on file prior to visit.    BP 80/40  Pulse 60  Resp 16  Wt 152 lb (68.947 kg)  BMI 23.12 kg/m2        Review of Systems  Constitutional: Positive for fatigue. Negative for fever, chills and appetite change.  HENT: Negative for hearing loss, ear pain, congestion, sore throat, trouble swallowing, neck stiffness, dental problem, voice change and tinnitus.   Eyes: Negative for pain, discharge and visual disturbance.  Respiratory: Negative for cough, chest tightness, wheezing and stridor.   Cardiovascular: Negative for chest pain,  palpitations and leg swelling.  Gastrointestinal: Negative for nausea, vomiting, abdominal pain, diarrhea, constipation, blood in stool and abdominal distention.  Genitourinary: Negative for urgency, hematuria, flank pain, discharge, difficulty urinating and genital sores.  Musculoskeletal: Negative for myalgias, back pain, joint swelling, arthralgias and gait problem.  Skin: Positive for wound. Negative for rash.  Neurological: Positive for tremors, weakness and light-headedness. Negative for dizziness, syncope, speech difficulty, numbness and headaches.  Hematological: Negative for adenopathy. Does not bruise/bleed easily.  Psychiatric/Behavioral: Positive for sleep disturbance. Negative for behavioral problems and dysphoric mood. The patient is not nervous/anxious.        Objective:   Physical Exam  Constitutional: He is oriented to person, place, and time. He appears well-developed.  Elderly frail Fairly alert and appropriate Blood pressure 80/40 pulse rate 60 weight 152 O2 saturation 91%    HENT:  Head: Normocephalic.  Right Ear: External ear normal.  Left Ear: External ear normal.  Eyes: Conjunctivae and EOM are normal.  Neck: Normal range of motion.  Cardiovascular: Normal rate and normal heart sounds.   Pulmonary/Chest: Breath sounds normal. He has no rales.  Abdominal: Bowel sounds are normal.  Musculoskeletal: Normal range of motion. He exhibits no edema and no tenderness.  Neurological: He is alert and oriented to person, place, and time.  Psychiatric: He has a normal mood and affect. His behavior is normal.          Assessment & Plan:  Congestive heart failure. We'll decrease furosemide to when necessary only in view of his symptomatically hypertension Cognitive impairment Neurasthenia. Will check electrolytes and followup lab  Recheck 3 months

## 2012-12-07 ENCOUNTER — Ambulatory Visit: Payer: Federal, State, Local not specified - PPO | Admitting: Internal Medicine

## 2012-12-22 ENCOUNTER — Other Ambulatory Visit: Payer: Self-pay | Admitting: Internal Medicine

## 2012-12-22 ENCOUNTER — Other Ambulatory Visit: Payer: Self-pay | Admitting: *Deleted

## 2012-12-22 MED ORDER — BUDESONIDE 3 MG PO CP24: 9.0000 mg | ORAL_CAPSULE | ORAL | Status: DC

## 2013-01-02 ENCOUNTER — Other Ambulatory Visit: Payer: Self-pay

## 2013-01-02 MED ORDER — DIPHENOXYLATE-ATROPINE 2.5-0.025 MG PO TABS: ORAL_TABLET | ORAL | Status: DC

## 2013-01-16 ENCOUNTER — Emergency Department (HOSPITAL_COMMUNITY): Payer: Federal, State, Local not specified - PPO

## 2013-01-16 ENCOUNTER — Encounter (HOSPITAL_COMMUNITY): Payer: Self-pay | Admitting: Emergency Medicine

## 2013-01-16 ENCOUNTER — Telehealth: Payer: Self-pay | Admitting: Internal Medicine

## 2013-01-16 ENCOUNTER — Emergency Department (HOSPITAL_COMMUNITY)
Admission: EM | Admit: 2013-01-16 | Discharge: 2013-01-17 | Disposition: A | Discharge status: Home / Self Care | Payer: Federal, State, Local not specified - PPO | Attending: Emergency Medicine | Admitting: Emergency Medicine

## 2013-01-16 DIAGNOSIS — F0390 Unspecified dementia without behavioral disturbance: Secondary | ICD-10-CM | POA: Insufficient documentation

## 2013-01-16 DIAGNOSIS — Z87442 Personal history of urinary calculi: Secondary | ICD-10-CM | POA: Insufficient documentation

## 2013-01-16 DIAGNOSIS — Z8669 Personal history of other diseases of the nervous system and sense organs: Secondary | ICD-10-CM | POA: Insufficient documentation

## 2013-01-16 DIAGNOSIS — R259 Unspecified abnormal involuntary movements: Secondary | ICD-10-CM | POA: Insufficient documentation

## 2013-01-16 DIAGNOSIS — Z87891 Personal history of nicotine dependence: Secondary | ICD-10-CM | POA: Insufficient documentation

## 2013-01-16 DIAGNOSIS — Z8546 Personal history of malignant neoplasm of prostate: Secondary | ICD-10-CM | POA: Insufficient documentation

## 2013-01-16 DIAGNOSIS — F329 Major depressive disorder, single episode, unspecified: Secondary | ICD-10-CM | POA: Insufficient documentation

## 2013-01-16 DIAGNOSIS — Z85828 Personal history of other malignant neoplasm of skin: Secondary | ICD-10-CM | POA: Insufficient documentation

## 2013-01-16 DIAGNOSIS — Z85038 Personal history of other malignant neoplasm of large intestine: Secondary | ICD-10-CM | POA: Insufficient documentation

## 2013-01-16 DIAGNOSIS — G20 Parkinson's disease: Secondary | ICD-10-CM

## 2013-01-16 DIAGNOSIS — R5383 Other fatigue: Secondary | ICD-10-CM

## 2013-01-16 DIAGNOSIS — F3289 Other specified depressive episodes: Secondary | ICD-10-CM | POA: Insufficient documentation

## 2013-01-16 DIAGNOSIS — Z79899 Other long term (current) drug therapy: Secondary | ICD-10-CM | POA: Insufficient documentation

## 2013-01-16 DIAGNOSIS — K219 Gastro-esophageal reflux disease without esophagitis: Secondary | ICD-10-CM | POA: Insufficient documentation

## 2013-01-16 DIAGNOSIS — M810 Age-related osteoporosis without current pathological fracture: Secondary | ICD-10-CM | POA: Insufficient documentation

## 2013-01-16 DIAGNOSIS — Z7902 Long term (current) use of antithrombotics/antiplatelets: Secondary | ICD-10-CM | POA: Insufficient documentation

## 2013-01-16 DIAGNOSIS — Z8673 Personal history of transient ischemic attack (TIA), and cerebral infarction without residual deficits: Secondary | ICD-10-CM | POA: Insufficient documentation

## 2013-01-16 DIAGNOSIS — R5381 Other malaise: Secondary | ICD-10-CM | POA: Insufficient documentation

## 2013-01-16 DIAGNOSIS — I10 Essential (primary) hypertension: Secondary | ICD-10-CM | POA: Insufficient documentation

## 2013-01-16 DIAGNOSIS — Z8719 Personal history of other diseases of the digestive system: Secondary | ICD-10-CM | POA: Insufficient documentation

## 2013-01-16 DIAGNOSIS — I509 Heart failure, unspecified: Secondary | ICD-10-CM | POA: Insufficient documentation

## 2013-01-16 DIAGNOSIS — G20A1 Parkinson's disease without dyskinesia, without mention of fluctuations: Secondary | ICD-10-CM | POA: Insufficient documentation

## 2013-01-16 LAB — CBC WITH DIFFERENTIAL/PLATELET
Basophils Absolute: 0 10*3/uL (ref 0.0–0.1)
Basophils Relative: 1 % (ref 0–1)
HCT: 36.9 % — ABNORMAL LOW (ref 39.0–52.0)
Hemoglobin: 12.3 g/dL — ABNORMAL LOW (ref 13.0–17.0)
Lymphocytes Relative: 20 % (ref 12–46)
MCHC: 33.3 g/dL (ref 30.0–36.0)
Monocytes Absolute: 0.7 10*3/uL (ref 0.1–1.0)
Monocytes Relative: 9 % (ref 3–12)
Neutro Abs: 5 10*3/uL (ref 1.7–7.7)
Neutrophils Relative %: 69 % (ref 43–77)
RDW: 13.5 % (ref 11.5–15.5)
WBC: 7.3 10*3/uL (ref 4.0–10.5)

## 2013-01-16 LAB — COMPREHENSIVE METABOLIC PANEL
AST: 22 U/L (ref 0–37)
Albumin: 3.6 g/dL (ref 3.5–5.2)
Alkaline Phosphatase: 85 U/L (ref 39–117)
CO2: 28 mEq/L (ref 19–32)
Chloride: 104 mEq/L (ref 96–112)
Creatinine, Ser: 1.08 mg/dL (ref 0.50–1.35)
GFR calc non Af Amer: 59 mL/min — ABNORMAL LOW (ref >90)
Potassium: 4.5 mEq/L (ref 3.5–5.1)
Total Bilirubin: 1 mg/dL (ref 0.3–1.2)

## 2013-01-16 LAB — URINALYSIS, ROUTINE W REFLEX MICROSCOPIC
Bilirubin Urine: NEGATIVE
Glucose, UA: NEGATIVE mg/dL
Hgb urine dipstick: NEGATIVE
Ketones, ur: NEGATIVE mg/dL
Specific Gravity, Urine: 1.022 (ref 1.005–1.030)
pH: 5.5 (ref 5.0–8.0)

## 2013-01-16 LAB — APTT: aPTT: 31 seconds (ref 24–37)

## 2013-01-16 MED ORDER — SODIUM CHLORIDE 0.9 % IV SOLN
1000.0000 mL | INTRAVENOUS | Status: DC
  Administered 2013-01-16: 1000 mL via INTRAVENOUS

## 2013-01-16 MED ORDER — BUDESONIDE 3 MG PO CP24: 9.0000 mg | ORAL_CAPSULE | ORAL | Status: DC

## 2013-01-16 NOTE — ED Notes (Signed)
Pt returned from CT, family now at bedside

## 2013-01-16 NOTE — ED Notes (Signed)
Pt transported to CT ?

## 2013-01-16 NOTE — Telephone Encounter (Signed)
Patient Information:  Caller Name: Randa Evens  Phone: [phone]  Patient: [name], [name]  Gender: Male  DOB: Jan 30, 1922  Age: 79 Years  PCP: Eleonore Chiquito [facility])  Office Follow Up:  Does the office need to follow up with this patient?: Yes  Instructions For The Office: wants refill/renewal on entocort; states cannot get out of the house to be seen in Dr. Ardell Isaacs office anymore; insisted on PCP office reviewing this for refill krs/can  RN Note:  Dr. Russella Dar placed the patient on Entocort and needs refill on it, but Dr. Russella Dar refuses to refill the medication without seeing him again.  States he is incapacitated, and is unable to assist with his self care.  States he cannot be taken to the doctor's office.  Wants to know if Dr. Amador Cunas can renew his Entocort for him.  Declines new triage; info to office for provider review/callback.  Uses [facility].  May reach family at [phone].  krs/can  Symptoms  Reason For Call & Symptoms: urinary symptoms  Reviewed Health History In EMR: Yes  Reviewed Medications In EMR: Yes  Reviewed Allergies In EMR: Yes  Reviewed Surgeries / Procedures: Yes  Date of Onset of Symptoms: 01/16/2013  Guideline(s) Used:  No Protocol Available - Information Only  Disposition Per Guideline:   Discuss with PCP and Callback by Nurse Today  Reason For Disposition Reached:   Nursing judgment  Advice Given:  N/A  Patient Will Follow Care Advice:  YES

## 2013-01-16 NOTE — Telephone Encounter (Signed)
Ok to [employer]

## 2013-01-16 NOTE — ED Provider Notes (Signed)
History   CSN: [id number] Arrival date & time 01/16/13  1904 First MD Initiated Contact with Patient 01/16/13 1907     Chief Complaint  Patient presents with  . Altered Mental Status   Level V caveat: Dementia and confusion HPI The patient has history of dementia and lives at home with his family. Patient was sent in by EMS.  According to EMS, the family called them because the patient was not acting normally today and is having difficulty walking around which was unusual for him. Patient here in the emergency room denies any specific complaints. He cannot tell me why he is here in the emergency room. He denies any chest pain, headache, abdominal pain. Denies vomiting or diarrhea. Patient denies fevers or coughing. History is limited in that the family is not currently at the bedside.  Past Medical History  Diagnosis Date  . DEPRESSION 04/03/2007  . Diarrhea 06/20/2008  . DIVERTICULITIS, HX OF 04/03/2007  . GERD 08/31/2007  . HYPERTENSION 04/03/2007  . MILD COGNITIVE IMPAIRMENT SO STATED 08/01/2008  . NEPHROLITHIASIS, HX OF 04/03/2007  . Neurasthenia 03/10/2009  . OSTEOPOROSIS 08/31/2007  . PROSTATE CANCER, HX OF 04/03/2007  . RADIATION PROCTITIS 01/14/2010  . SKIN CANCER, HX OF 04/03/2007    Had some removed   . TRANSIENT ISCHEMIC ATTACK 08/14/2009  . Lymphocytic colitis   . History of colon cancer 1960  . CHF (congestive heart failure)     Past Surgical History  Procedure Laterality Date  . Hemicolectomy  1960's  . Lumbar laminectomy  2006  . Prostate biopsy  1999  . Empyema drainage  1934  . Inguinal hernia repair  4098,1191  . Eye surgery  2009    Both eyes  . Rib resection    . Cataract extraction, bilateral  11/2008    Family History  Problem Relation Age of Onset  . Heart attack Father 23  . Liver cancer Mother 83  . Colon cancer Neg Hx     History  Substance Use Topics  . Smoking status: Former Smoker    Quit date: 04/07/1979  . Smokeless tobacco: Never Used  . Alcohol  Use: No      Review of Systems  All other systems reviewed and are negative.    Allergies  Review of patient's allergies indicates no known allergies.  Home Medications   Current Outpatient Rx  Name  Route  Sig  Dispense  Refill  . AMBULATORY NON FORMULARY MEDICATION      Pressure Vision/ Lutein- One tablet by mouth twice daily         . budesonide (ENTOCORT EC) 3 MG 24 hr capsule   Oral   Take 3 capsules (9 mg total) by mouth every morning.   90 capsule   2   . clopidogrel (PLAVIX) 75 MG tablet   Oral   Take 75 mg by mouth daily.         . diphenoxylate-atropine (LOMOTIL) 2.5-0.025 MG per tablet      TAKE 1 TABLET BY MOUTH EVERY DAY FOR DIARRHEA   30 tablet   0     NEEDS FOLLOW UP OFFICE VISIT   . escitalopram (LEXAPRO) 20 MG tablet   Oral   Take 20 mg by mouth daily.         . famotidine (PEPCID) 20 MG tablet   Oral   Take 20 mg by mouth at bedtime.           . furosemide (LASIX) 20 MG  tablet   Oral   Take 20 mg by mouth 2 (two) times a week.         . memantine (NAMENDA) 10 MG tablet   Oral   Take 10 mg by mouth 2 (two) times daily.         . Omega-3 Fatty Acids (FISH OIL) 1000 MG CAPS   Oral   Take 2 capsules by mouth daily.           . rivastigmine (EXELON) 9.5 mg/24hr   Transdermal   Place 1 patch onto the skin daily.           . vitamin B-12 (CYANOCOBALAMIN) 500 MCG tablet   Oral   Take 500 mcg by mouth daily.           BP 162/103  Pulse 53  Temp(Src) 98.4 F (36.9 C) (Oral)  Resp 19  SpO2 98%  Physical Exam  Nursing note and vitals reviewed. Constitutional: He appears well-developed. No distress.  Elderly, fell  HENT:  Head: Normocephalic and atraumatic.  Right Ear: External ear normal.  Left Ear: External ear normal.  Mouth/Throat: No oropharyngeal exudate.  Mucous membranes dry  Eyes: Conjunctivae are normal. Right eye exhibits no discharge. Left eye exhibits no discharge. No scleral icterus.  Neck:  Neck supple. No tracheal deviation present.  Cardiovascular: Normal rate, regular rhythm and intact distal pulses.   Pulmonary/Chest: Effort normal and breath sounds normal. No stridor. No respiratory distress. He has no wheezes. He has no rales.  Abdominal: Soft. Bowel sounds are normal. He exhibits no distension. There is no tenderness. There is no rebound and no guarding.  Musculoskeletal: He exhibits no edema and no tenderness.  Neurological: He is alert. He has normal strength. He is disoriented. He displays tremor. No sensory deficit. Cranial nerve deficit:  no gross defecits noted. He exhibits normal muscle tone. He displays no seizure activity. GCS eye subscore is 4. GCS verbal subscore is 4. GCS motor subscore is 6.  Normal extraocular movements, no facial droop, tongue is midline, generalized weakness but patient is able to lift both arms and legs off the bed without difficulty, sensation is intact in all extremities  Skin: Skin is warm and dry. No rash noted.  Psychiatric: He has a normal mood and affect.    ED Course  Procedures (including critical care time) EKG Normal sinus rhythm rate 60 to Left axis deviation Normal ST-T waves No significant change when compared to prior EKG  Labs Reviewed  CBC WITH DIFFERENTIAL - Abnormal; Notable for the following:    RBC 3.79 (*)    Hemoglobin 12.3 (*)    HCT 36.9 (*)    All other components within normal limits  COMPREHENSIVE METABOLIC PANEL - Abnormal; Notable for the following:    BUN 24 (*)    GFR calc non Af Amer 59 (*)    GFR calc Af Amer 68 (*)    All other components within normal limits  URINALYSIS, ROUTINE W REFLEX MICROSCOPIC - Abnormal; Notable for the following:    APPearance CLOUDY (*)    All other components within normal limits  APTT  PROTIME-INR   Ct Head Wo Contrast  01/16/2013  *RADIOLOGY REPORT*  Clinical Data: 79 year old male with altered mental status.  CT HEAD WITHOUT CONTRAST  Technique:  Contiguous  axial images were obtained from the base of the skull through the vertex without contrast.  Comparison: 09/10/2011 CT  Findings: Mild generalized cerebral volume loss and mild chronic  small vessel white matter ischemic changes again noted.  No acute intracranial abnormalities are identified, including mass lesion or mass effect, hydrocephalus, extra-axial fluid collection, midline shift, hemorrhage, or acute infarction.  The visualized bony calvarium is unremarkable.  IMPRESSION: No evidence of acute intracranial abnormality.   Original Report Authenticated By: Harmon Pier, M.D.    Dg Chest Port 1 View  01/16/2013  *RADIOLOGY REPORT*  Clinical Data: CHF and altered mental status.  PORTABLE CHEST - 1 VIEW  Comparison: 09/10/2011  Findings: Cardiomegaly is noted. Mild left basilar atelectasis noted. There is no evidence of focal airspace disease, pulmonary edema, suspicious pulmonary nodule/mass, pleural effusion, or pneumothorax. No acute bony abnormalities are identified.  IMPRESSION: Cardiomegaly with mild left basilar atelectasis.   Original Report Authenticated By: Harmon Pier, M.D.      1. Fatigue   2. Parkinsonism     MDM  The patient's family arrived and I was able to obtain additional history. They state that he has had a gradual decline in his health and cognitive state. He often has this pattern of certain days wanting to sleep the entire day. He will often sleeps 16 hours a day. They were concerned today because he was less active than usual. I discussed the results of his evaluation today with them. There does not appear to be any acute abnormalities. He does not appear to have any sign of acute infection. His vital signs are otherwise stable. I spoke to him that I could not exclude an occult stroke or TIA. Overall, however his symptoms sound to be more of a chronic nature. The family at this time is comfortable taking him home.  I offered admission for further evaluation. They would prefer to  take the patient home at this time.        Celene Kras, MD 01/16/13 [phone]

## 2013-01-16 NOTE — ED Notes (Signed)
Primary RN unsuccessful with IV start. 2nd RN to try.

## 2013-01-16 NOTE — ED Notes (Signed)
Pt unable to ambulate in hall. Pt refused.

## 2013-01-16 NOTE — ED Notes (Signed)
Pt is medically cleared. Per family, would like to be discharged. Pt family instructed if any abnormalities are noted to come back to ED.

## 2013-01-16 NOTE — ED Notes (Signed)
Family stated that patient has not been acting right today. They were having trouble waking him and called EMS. Has history of dementia.

## 2013-01-16 NOTE — Telephone Encounter (Signed)
Spoke to pt's wife told her Rx for Entocort was sent to pharmacy. She verbalized understanding.

## 2013-01-18 ENCOUNTER — Ambulatory Visit (INDEPENDENT_AMBULATORY_CARE_PROVIDER_SITE_OTHER): Payer: Federal, State, Local not specified - PPO | Admitting: Internal Medicine

## 2013-01-18 ENCOUNTER — Encounter: Payer: Self-pay | Admitting: Internal Medicine

## 2013-01-18 VITALS — BP 108/70 | HR 80 | Temp 97.9°F | Resp 20 | Wt 148.0 lb

## 2013-01-18 DIAGNOSIS — G3184 Mild cognitive impairment, so stated: Secondary | ICD-10-CM

## 2013-01-18 DIAGNOSIS — G20 Parkinson's disease: Secondary | ICD-10-CM

## 2013-01-18 DIAGNOSIS — F329 Major depressive disorder, single episode, unspecified: Secondary | ICD-10-CM

## 2013-01-18 DIAGNOSIS — G20C Parkinsonism, unspecified: Secondary | ICD-10-CM

## 2013-01-18 DIAGNOSIS — I509 Heart failure, unspecified: Secondary | ICD-10-CM

## 2013-01-18 DIAGNOSIS — F3289 Other specified depressive episodes: Secondary | ICD-10-CM

## 2013-01-18 DIAGNOSIS — G20A1 Parkinson's disease without dyskinesia, without mention of fluctuations: Secondary | ICD-10-CM

## 2013-01-18 DIAGNOSIS — F488 Other specified nonpsychotic mental disorders: Secondary | ICD-10-CM

## 2013-01-18 NOTE — Progress Notes (Signed)
Subjective:    Patient ID: [name], male    DOB: 10-31-1921, 79 y.o.   MRN: [id number]  HPI   79 year old patient who is seen today following an ED evaluation 2 days ago. The family states the patient became largely unresponsive for several hours and finally transfer the patient to the ED via EMS. Evaluation included a laboratory screen and head CT that was nonrevealing. The patient has dementia neurasthenia and there is been a general decline over the past few years. He also has a history of cervical vascular disease and was admitted in 2010 for an acute stroke symptoms.  He has a history of Parkinson's disease.  He also has a history of congestive heart failure manifested by HF  symptoms pulmonary rales and peripheral edema.  BNP has never been significantly elevated Management has been limited by hypotension.  Patient uses Lasix when necessary only and very infrequently. The patient's family has become fairly exhausted with his 24/7 assistance. The patient's family describes frequent episodes of apparent loss of consciousness with the patient staring and unresponsive for several seconds;  this also results in the patient falling if he does not receive assistance  Past Medical History  Diagnosis Date  . DEPRESSION 04/03/2007  . Diarrhea 06/20/2008  . DIVERTICULITIS, HX OF 04/03/2007  . GERD 08/31/2007  . HYPERTENSION 04/03/2007  . MILD COGNITIVE IMPAIRMENT SO STATED 08/01/2008  . NEPHROLITHIASIS, HX OF 04/03/2007  . Neurasthenia 03/10/2009  . OSTEOPOROSIS 08/31/2007  . PROSTATE CANCER, HX OF 04/03/2007  . RADIATION PROCTITIS 01/14/2010  . SKIN CANCER, HX OF 04/03/2007    Had some removed   . TRANSIENT ISCHEMIC ATTACK 08/14/2009  . Lymphocytic colitis   . History of colon cancer 1960  . CHF (congestive heart failure)     History   Social History  . Marital Status: Married    Spouse Name: N/A    Number of Children: N/A  . Years of Education: N/A   Occupational History  . Retired     Social History Main Topics  . Smoking status: Former Smoker    Quit date: 04/07/1979  . Smokeless tobacco: Never Used  . Alcohol Use: No  . Drug Use: No  . Sexually Active: Not on file   Other Topics Concern  . Not on file   Social History Narrative  . No narrative on file    Past Surgical History  Procedure Laterality Date  . Hemicolectomy  1960's  . Lumbar laminectomy  2006  . Prostate biopsy  1999  . Empyema drainage  1934  . Inguinal hernia repair  4098,[id number]  . Eye surgery  2009    Both eyes  . Rib resection    . Cataract extraction, bilateral  11/2008    Family History  Problem Relation Age of Onset  . Heart attack Father 61  . Liver cancer Mother 35  . Colon cancer Neg Hx     No Known Allergies  Current Outpatient Prescriptions on File Prior to Visit  Medication Sig Dispense Refill  . AMBULATORY NON FORMULARY MEDICATION Pressure Vision/ Lutein- One tablet by mouth twice daily      . budesonide (ENTOCORT EC) 3 MG 24 hr capsule Take 3 capsules (9 mg total) by mouth every morning.  90 capsule  2  . clopidogrel (PLAVIX) 75 MG tablet Take 75 mg by mouth daily.      . diphenoxylate-atropine (LOMOTIL) 2.5-0.025 MG per tablet TAKE 1 TABLET BY MOUTH EVERY DAY FOR DIARRHEA  30 tablet  0  . escitalopram (LEXAPRO) 20 MG tablet Take 20 mg by mouth daily.      . famotidine (PEPCID) 20 MG tablet Take 20 mg by mouth at bedtime.        . furosemide (LASIX) 20 MG tablet Take 20 mg by mouth 2 (two) times a week.      . memantine (NAMENDA) 10 MG tablet Take 10 mg by mouth 2 (two) times daily.      . Omega-3 Fatty Acids (FISH OIL) 1000 MG CAPS Take 2 capsules by mouth daily.        . rivastigmine (EXELON) 9.5 mg/24hr Place 1 patch onto the skin daily.        . vitamin B-12 (CYANOCOBALAMIN) 500 MCG tablet Take 500 mcg by mouth daily.       No current facility-administered medications on file prior to visit.    BP 108/70  Pulse 80  Temp(Src) 97.9 F (36.6 C) (Oral)  Resp  20  Wt 148 lb (67.132 kg)  BMI 22.51 kg/m2  SpO2 88%       Review of Systems  Constitutional: Positive for activity change, appetite change and fatigue. Negative for fever and chills.  HENT: Negative for hearing loss, ear pain, congestion, sore throat, trouble swallowing, neck stiffness, dental problem, voice change and tinnitus.   Eyes: Negative for pain, discharge and visual disturbance.  Respiratory: Positive for shortness of breath. Negative for cough, chest tightness, wheezing and stridor.   Cardiovascular: Positive for leg swelling. Negative for chest pain and palpitations.  Gastrointestinal: Negative for nausea, vomiting, abdominal pain, diarrhea, constipation, blood in stool and abdominal distention.  Genitourinary: Negative for urgency, hematuria, flank pain, discharge, difficulty urinating and genital sores.  Musculoskeletal: Positive for gait problem. Negative for myalgias, back pain, joint swelling and arthralgias.  Skin: Negative for rash.  Neurological: Positive for weakness. Negative for dizziness, syncope, speech difficulty, numbness and headaches.  Hematological: Negative for adenopathy. Does not bruise/bleed easily.  Psychiatric/Behavioral: Positive for behavioral problems, confusion, dysphoric mood, decreased concentration and agitation. The patient is not nervous/anxious.        Objective:   Physical Exam  Constitutional: He is oriented to person, place, and time. He appears well-developed.  Appears weak and chronically ill No acute distress Blood pressure 84/64 O2 saturation 91%  HENT:  Head: Normocephalic.  Right Ear: External ear normal.  Left Ear: External ear normal.  Eyes: Conjunctivae and EOM are normal.  Neck: Normal range of motion.  Cardiovascular: Normal rate and normal heart sounds.   Pulmonary/Chest: Effort normal. He has rales.  Rales involving the lower one half lung fields bilaterally  Abdominal: Bowel sounds are normal.  Musculoskeletal:  Normal range of motion. He exhibits no edema and no tenderness.  Scattered distal lower extremity ecchymoses but no significant edema  Neurological: He is alert and oriented to person, place, and time.  Psychiatric: He has a normal mood and affect. His behavior is normal.          Assessment & Plan:    Progressive functional decline  with dependent in all aspects of daily living. The patient was given a prescription for a hospital bed. They will consider long-term nursing home placement.   Dementia /history of Parkinson's disease.   The patient's family describes brief episodes of LOC and it is unclear whether he may be having brief seizure activity.   He has a long history of Parkinson's disease  but no apparent history of tremor or  rigidity.   Patient certainly has  bradykinesia postural instability  and possible autonomic dysfunction with hypotension.   He probably exhibits craniofacial features of PD and family may be describing episodes of  "freezing "when he attempts to ambulate.  Might  consider dementia with Lewy bodies  but doubtful without history of visual hallucinations.  Options discussed;  we'll refer to neurology  for consideration of EEG or other diagnostic studies and treatment options   Dementia  DDx;  Multi infact, AD, DLB or related to PD  ??  Congestive heart failure.   Fairly compensated at present. Will hold diuretic therapy in view of his hypotension.

## 2013-01-18 NOTE — Patient Instructions (Addendum)
Limit your sodium (Salt) intake  Neurology evaluation as discussed  Taper and discontinue Lexapro as discussed

## 2013-01-29 ENCOUNTER — Ambulatory Visit (INDEPENDENT_AMBULATORY_CARE_PROVIDER_SITE_OTHER): Payer: Federal, State, Local not specified - PPO | Admitting: Neurology

## 2013-01-29 ENCOUNTER — Encounter: Payer: Self-pay | Admitting: Neurology

## 2013-01-29 VITALS — BP 100/58 | HR 88 | Resp 18

## 2013-01-29 DIAGNOSIS — R482 Apraxia: Secondary | ICD-10-CM | POA: Insufficient documentation

## 2013-01-29 DIAGNOSIS — G219 Secondary parkinsonism, unspecified: Secondary | ICD-10-CM | POA: Insufficient documentation

## 2013-01-29 DIAGNOSIS — R488 Other symbolic dysfunctions: Secondary | ICD-10-CM

## 2013-01-29 DIAGNOSIS — G4752 REM sleep behavior disorder: Secondary | ICD-10-CM | POA: Insufficient documentation

## 2013-01-29 DIAGNOSIS — F0391 Unspecified dementia with behavioral disturbance: Secondary | ICD-10-CM

## 2013-01-29 NOTE — Progress Notes (Signed)
[name] was seen today in the movement disorders clinic for neurologic consultation at the request of Rogelia Boga, MD.  The consultation is for the evaluation of parkinsonism and dementia.  He is accompanied by wife and 2 children who supplement the hx.     The pt was dx with Parkinsonism 3 years ago when he had a TIA.  He went to the hospital b/c he was having trouble moving and had collapsed in the bathroom.  He was conscious but his had facial droop (unknown side).  Sx's lasted a few hours.  He did not have tremor at that point in time but he had shuffling gait prior to that.  He was evaluated in the hospital and told by the neurologist that he had parkinsonism.  There was no outside f/u with a neurologist.  I reviewed in November, 2010 records that were made available to me.  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  I do not see any mention of parkinsonism.  Several weeks ago, the patient had an episode of closing eyes, fully conscious but not responding appropriately.  No etiology was found.     Specific Symptoms:  Tremor: yes, mainly in the R hand hand but L as well Voice: hypophonic speech but getting worse Sleep: sleeps well (15-16 hours/day)  Vivid Dreams:  yes  Acting out dreams:  yes Wet Pillows: no Postural symptoms:  yes (WC x 7-8 weeks but "big"  balance problem for 7-8 months  Falls?  yes Bradykinesia symptoms: slowed thought processes, reduced facial expressions, difficulty getting out of a chair and difficulty regaining balance Loss of smell:  no Loss of taste:  no Urinary Incontinence:  yes for 3 years, degraded over the last 3-4 months but near complete incontinence Difficulty Swallowing:  yes (rarely choking while eating) Handwriting, micrographia: unknown Trouble with ADL's:  yes, complete assistance with ADL's  Trouble buttoning clothing: unknown Depression:  no , but easily angered which is unusual.    Memory changes:  yes (believe that this may have preceeded TIA dx 3 years ago) Hallucinations:  yes  visual distortions: no N/V:  no Lightheaded:  no  Syncope: yes, generally related to hypotension (over 3 years ago) Diplopia:  no Dyskinesia:  no  Neuroimaging has  previously been performed.  It is available for my review today.  I reviewed his MRI of the brain without gadolinium from 2010.  There was marked atrophy, both central and peripheral.  There was a moderate amount of small vessel disease scattered throughout the subcortical white matter.  This was not significant in the basal ganglia.  A CT from April, 2014 showed continued atrophy.  *RADIOLOGY REPORT*   Clinical Data: 79 year old male with altered mental status.   CT HEAD WITHOUT CONTRAST   Technique:  Contiguous axial images were obtained from the base of the skull through the vertex without contrast.   Comparison: 09/10/2011 CT   Findings: Mild generalized cerebral volume loss and mild chronic small vessel white matter ischemic changes again noted.   No acute intracranial abnormalities are identified, including mass lesion or mass effect, hydrocephalus, extra-axial fluid collection, midline shift, hemorrhage, or acute infarction.   The visualized bony calvarium is unremarkable.   IMPRESSION: No evidence of acute intracranial abnormality. Clinical Data:  Weakness.  Slurred speech.  Left facial droop.    MRI HEAD WITHOUT CONTRAST MRA HEAD WITHOUT CONTRAST    Technique: Multiplanar, multiecho pulse sequences of the  brain and surrounding structures were obtained according to standard protocol without intravenous contrast.  Angiographic images of the head were obtained using MRA technique without contrast.    Comparison: No comparison MR.  Prior head CT 08/14/2009.    MRI HEAD    Findings:  Small acute non hemorrhagic infarcts scattered throughout the right hemisphere involving portions of the right frontal  lobe, right parietal lobe, right occipital lobe and right temporal lobe.    No intracranial hemorrhage.  Moderate small vessel disease type changes.  Global atrophy without hydrocephalus.  No intracranial mass lesion detected on this unenhanced exam.  Minimal paranasal sinus mucosal thickening.    Mild transverse ligament is feet.  Minimal bulge C3-4.    IMPRESSION: Scattered small acute non hemorrhagic infarcts throughout the right hemisphere as noted above.    MRA HEAD    Findings: Anterior circulation without medium or large size vessel significant stenosis or occlusion.    Left vertebral artery is dominant in size.  No significant narrowing of the basilar artery.  Mild branch vessel irregularity. No aneurysm noted.    IMPRESSION: Mild intracranial atherosclerotic type changes as discussed above.   PREVIOUS MEDICATIONS: none to date  ALLERGIES:  No Known Allergies  CURRENT MEDICATIONS:  Current Outpatient Prescriptions on File Prior to Visit  Medication Sig Dispense Refill  . AMBULATORY NON FORMULARY MEDICATION Pressure Vision/ Lutein- One tablet by mouth twice daily      . budesonide (ENTOCORT EC) 3 MG 24 hr capsule Take 3 capsules (9 mg total) by mouth every morning.  90 capsule  2  . clopidogrel (PLAVIX) 75 MG tablet Take 75 mg by mouth daily.      . diphenoxylate-atropine (LOMOTIL) 2.5-0.025 MG per tablet TAKE 1 TABLET BY MOUTH EVERY DAY FOR DIARRHEA  30 tablet  0  . famotidine (PEPCID) 20 MG tablet Take 20 mg by mouth at bedtime.        . furosemide (LASIX) 20 MG tablet Take 20 mg by mouth 2 (two) times a week.      . memantine (NAMENDA) 10 MG tablet Take 10 mg by mouth 2 (two) times daily.      . Omega-3 Fatty Acids (FISH OIL) 1000 MG CAPS Take 2 capsules by mouth daily.        . rivastigmine (EXELON) 9.5 mg/24hr Place 1 patch onto the skin daily.        . vitamin B-12 (CYANOCOBALAMIN) 500 MCG tablet Take 500 mcg by mouth daily.       No current  facility-administered medications on file prior to visit.    PAST MEDICAL HISTORY:   Past Medical History  Diagnosis Date  . DEPRESSION 04/03/2007  . Diarrhea 06/20/2008  . DIVERTICULITIS, HX OF 04/03/2007  . GERD 08/31/2007  . HYPERTENSION 04/03/2007  . MILD COGNITIVE IMPAIRMENT SO STATED 08/01/2008  . NEPHROLITHIASIS, HX OF 04/03/2007  . Neurasthenia 03/10/2009  . OSTEOPOROSIS 08/31/2007  . PROSTATE CANCER, HX OF 04/03/2007  . RADIATION PROCTITIS 01/14/2010  . SKIN CANCER, HX OF 04/03/2007    Had some removed   . TRANSIENT ISCHEMIC ATTACK 08/14/2009  . Lymphocytic colitis   . History of colon cancer 1960  . CHF (congestive heart failure)     PAST SURGICAL HISTORY:   Past Surgical History  Procedure Laterality Date  . Hemicolectomy  1960's  . Lumbar laminectomy  2006  . Prostate biopsy  1999  . Empyema drainage  1934  . Inguinal hernia repair  [id number]  . Eye  surgery  2009    Both eyes  . Rib resection    . Cataract extraction, bilateral  11/2008    SOCIAL HISTORY:   History   Social History  . Marital Status: Married    Spouse Name: N/A    Number of Children: N/A  . Years of Education: N/A   Occupational History  . Retired    Social History Main Topics  . Smoking status: Former Smoker    Quit date: 04/07/1979  . Smokeless tobacco: Never Used  . Alcohol Use: No  . Drug Use: No  . Sexually Active: Not on file   Other Topics Concern  . Not on file   Social History Narrative  . No narrative on file    FAMILY HISTORY:   No family status information on file.    ROS:  A complete 10 system review of systems was obtained and was unremarkable apart from what is mentioned above.  PHYSICAL EXAMINATION:    VITALS:   Filed Vitals:   01/29/13 1337  BP: 100/58  Pulse: 88  Resp: 18    GEN:  The patient appears stated age and is in NAD. HEENT:  Normocephalic, atraumatic.  The mucous membranes are moist. The superficial temporal arteries are without ropiness or  tenderness. CV:  RRR Lungs:  CTAB Neck/HEME:  There are no carotid bruits bilaterally.  Neurological examination:  Orientation: A complete MMSE was performed and the pt scored a 10/30. Cranial nerves: There is good facial symmetry.  There is intermittent eyelid opening apraxia.  Pupils are equal round and reactive to light bilaterally. Fundoscopic exam is attempted but the disc margins are not well visualized bilaterally.  Extraocular muscles are intact.  He has difficulty participating with formal confrontational visual field testing, but the visual fields do appear to be intact with visual minutes. The speech is fluent and clear but hypophonic. Soft palate rises symmetrically and there is no tongue deviation. Hearing is intact to conversational tone. Sensation: The patient has difficulty participating with a sensitive aspects of the sensory examination, but sensation does appear to be symmetric and no sensory dermatomal level was identified. Motor: Strength is 5/5 in the bilateral upper and lower extremities.   Shoulder shrug is equal and symmetric.  There is no pronator drift. Deep tendon reflexes: Deep tendon reflexes are 0-1 at the bilateral biceps, triceps, brachioradialis, patella and achilles. Plantar responses are downgoing bilaterally.  Movement examination: Tone: There is mildly increased tone in the bilateral upper extremities.  There is, however, a gegenhalten quality. The tone in the lower extremities is normal.  Abnormal movements: There is a mild tremor at rest seen and felt bilaterally in the upper extremities. Coordination:  There is  decremation with RAM's, most significantly with hand opening and closing and finger taps bilaterally.  He was able to perform heel taps and toe taps. Gait and Station: The patient is unable to arise without assistance.  Today, with minimal assistance, he was able to ambulate a few short steps but his family states that this is much better than he has  been doing as he was previously requiring maximal assistance and was not able to do anything alone.  He does shuffle.  He is apraxic with some of his motor commands.   ASSESSMENT/PLAN:  1.  Parkinsonism.  -Because the patient and his family have difficulty with remembering the exact sequence of events in terms of symptom progression, it is very good difficult to diagnose this as moderate to  moderately advanced Parkinson's or one of the other parkinsonian states, such as frontotemporal dementia.  I do not think he has Lewy body dementia, as hallucinations were not and are not a prominent feature and do not think that he has NPH, as he has both central and peripheral atrophy.  I did review November, 2010 records available to me and did not see any diagnosis of Parkinson's or any parkinsonian states.  -I talked to the patient and his family about options.  The biggest concern right now for them is orthostatic hypotension, as he has had several episodes of syncope because of it.  They state that he was taken off of his Lasix which seemed to help, although he has had some events since the discontinuation.  I am reluctant to try levodopa, and they do not want to try it, given the fact that it can worsen orthostasis.  He has never been on Florinef or midodrine, so I will ask his primary care physician if either of these is appropriate, given his history of congestive heart failure.  I e-mailed his primary care physician and I will leave this decision to his discretion.  -I am very concerned about the patient's safety.  I am going to send somebody out to do an occupational therapy home safety evaluation, and hopefully get him some grab bars and raised toilet seats.  He recently pulled out of a towel bar during a fall.  I talked to the patients family about the ability to manage his care 24 hours per day and they think that they are managing right now.  He will also have home physical therapy. 2.  REM behavior  disorder.  -This is common with alpha synucleinopathies.  His wife has been concerned about this, because he has been getting out of bed in the middle of the night and falling over the last several weeks.  Right now, however, they do not wish to try the clonazepam as just today had a hospital bed delivery and are hoping that having the rails will provide his safety mechanism without any medication. 3.  Dementia.  -He is already on Exelon patch, 9.5 mg daily and is having no problems with this.  I agree with this.  He is also on Namenda at, 10 mg twice a day and has been on several years without a problem.  -He is having some behavioral outbursts, and if this becomes a problem then I would recommend Seroquel.  We talked about this today, but decided to hold off. 4.  eyelid opening apraxia.  -The patient definitely had some of this in the office today.  It sounds like this is why they took him to the emergency room yesterday and the ER told them that he was exhausted and therefore not opening the eyes.  I explained to him that this is a problem with Parkinson's disease and is not associated with exhaustion.  If it becomes worse and interferes with his activities of daily living, including walking, then we can do Botox, which would be of great value. 5. I will schedule a f/u in the next 6 weeks.

## 2013-01-30 ENCOUNTER — Telehealth: Payer: Self-pay | Admitting: *Deleted

## 2013-01-30 ENCOUNTER — Other Ambulatory Visit: Payer: Self-pay | Admitting: Internal Medicine

## 2013-01-30 MED ORDER — FLUDROCORTISONE ACETATE 0.1 MG PO TABS: 0.1000 mg | ORAL_TABLET | Freq: Every day | ORAL | Status: DC

## 2013-01-30 NOTE — Telephone Encounter (Signed)
Spoke to pt told him new Rx was sent to pharmacy and need to make follow up appt in one week after starting medication per Dr.K Pt verbalized understanding.

## 2013-01-30 NOTE — Telephone Encounter (Signed)
Message copied by Jimmye Norman on Tue Jan 30, 2013  5:42 PM ------      Message from: Gordy Savers      Created: Tue Jan 30, 2013 12:32 PM       I have called in a new prescription for Mr. Corkery (Florinef);  please notify family that the prescription is available and to start taking one tablet every morning;  please have patient make an appointment to see me within one week of starting the medication.            ----- Message -----         From: Vladimir Faster, DO         Sent: 01/30/2013   9:59 AM           To: Gordy Savers, MD            Do you me to have him make an appt with you and you trial this or would you prefer I prescribe?      ----- Message -----         From: Gordy Savers, MD         Sent: 01/30/2013   8:03 AM           To: Octaviano Batty Tat, DO            Many thanks for seeing Mr Amos.  He has had significant issues with fluid overload in the past (pulmonary rales and peripheral edema)  but symptoms would seem to warrant a careful challenge with fluorinef.      Cindee Lame            ----- Message -----         From: Vladimir Faster, DO         Sent: 01/29/2013   2:58 PM           To: Gordy Savers, MD            Hello.  Thanks for the referral.  This was a nice family with a complicated history.  I had trouble ascertaining from the family the order in which things came (i.e. Memory loss, orthostasis, loss of balance, etc or tremor, slowness in movement followed by memory loss, balance change and orthostasis).  This does affect how he is dx.  He looks like advanced PD now but pts with secondary parkinsonism from things like FTD can look similarly.  I don't think that he has LBD as hallucinations visually are not that prominent.  Nonetheless, I am concerned that if I were to try Levodopa that we would lower BP and further exacerbate orthostasis.  Is there any reason that he cannot have fluorinef or any reason that he has not tried it thus far?  I told his family  that I would need your approval given his hx of CHF.  Most important is that I think the family needs more help!  Going to send OT to them for a home safety eval for grab bars, etc.            Thank you!!            Lurena Joiner                   ------

## 2013-02-08 ENCOUNTER — Encounter: Payer: Self-pay | Admitting: Internal Medicine

## 2013-02-08 ENCOUNTER — Ambulatory Visit (INDEPENDENT_AMBULATORY_CARE_PROVIDER_SITE_OTHER): Payer: Federal, State, Local not specified - PPO | Admitting: Internal Medicine

## 2013-02-08 VITALS — BP 84/50 | HR 78 | Temp 97.4°F | Resp 20 | Wt 149.5 lb

## 2013-02-08 DIAGNOSIS — G20 Parkinson's disease: Secondary | ICD-10-CM

## 2013-02-08 DIAGNOSIS — I951 Orthostatic hypotension: Secondary | ICD-10-CM

## 2013-02-08 DIAGNOSIS — R197 Diarrhea, unspecified: Secondary | ICD-10-CM

## 2013-02-08 DIAGNOSIS — I509 Heart failure, unspecified: Secondary | ICD-10-CM

## 2013-02-08 LAB — BASIC METABOLIC PANEL
BUN: 19 mg/dL (ref 6–23)
CO2: 30 mEq/L (ref 19–32)
Chloride: 106 mEq/L (ref 96–112)
Creatinine, Ser: 1.2 mg/dL (ref 0.4–1.5)
Glucose, Bld: 115 mg/dL — ABNORMAL HIGH (ref 70–99)
Potassium: 4.6 mEq/L (ref 3.5–5.1)

## 2013-02-08 MED ORDER — DIPHENOXYLATE-ATROPINE 2.5-0.025 MG PO TABS: ORAL_TABLET | ORAL | Status: DC

## 2013-02-08 NOTE — Progress Notes (Signed)
Subjective:    Patient ID: [name], male    DOB: [birth year], 79 y.o.   MRN: [id number]  HPI  79 year old patient who is seen today for followup of parkinsonian syndrome complicated by orthostatic hypotension. Since his last visit here he has been evaluated by neurology and is also had some home PT and OT ; his family states that he has done much better and has been much more lucid and more active.  He also has been able to walk short distances unassisted.  He and his family deny any orthostatic dizziness or syncope.  Do to orthostatic hypotension he was placed on Florinef one week ago. There has been no increase in peripheral edema or other symptoms of heart failure. He is scheduled for neurology followup in one month. The family does state that his blood pressure has been quite labile often with systolic readings in excess of 100 with the patient sitting.  Wt Readings from Last 3 Encounters:  01/18/13 148 lb (67.132 kg)  11/24/12 152 lb (68.947 kg)  09/07/12 152 lb (68.947 kg)    Past Medical History  Diagnosis Date  . DEPRESSION 04/03/2007  . Diarrhea 06/20/2008  . DIVERTICULITIS, HX OF 04/03/2007  . GERD 08/31/2007  . HYPERTENSION 04/03/2007    now with hypotension  . MILD COGNITIVE IMPAIRMENT SO STATED 08/01/2008  . NEPHROLITHIASIS, HX OF 04/03/2007  . Neurasthenia 03/10/2009  . OSTEOPOROSIS 08/31/2007  . PROSTATE CANCER, HX OF 04/02/1997  . RADIATION PROCTITIS 01/14/2010  . SKIN CANCER, HX OF 04/03/2007    Had some removed   . TRANSIENT ISCHEMIC ATTACK 08/14/2009  . Lymphocytic colitis   . History of colon cancer 1960  . CHF (congestive heart failure)     History   Social History  . Marital Status: Married    Spouse Name: N/A    Number of Children: N/A  . Years of Education: N/A   Occupational History  . Retired     Counsellor   Social History Main Topics  . Smoking status: Former Smoker    Quit date: 04/07/1979  . Smokeless tobacco: Never Used  . Alcohol Use: Yes      Comment: 1 glass wine every 2-3 weeks  . Drug Use: No  . Sexually Active: Not on file   Other Topics Concern  . Not on file   Social History Narrative  . No narrative on file    Past Surgical History  Procedure Laterality Date  . Hemicolectomy  1960's  . Lumbar laminectomy  2006  . Prostate biopsy  1999  . Empyema drainage  1934  . Inguinal hernia repair  7829,5621  . Eye surgery  2009    Both eyes  . Rib resection    . Cataract extraction, bilateral  11/2008    Family History  Problem Relation Age of Onset  . Heart attack Father 61  . Liver cancer Mother 98  . Colon cancer Neg Hx     No Known Allergies  Current Outpatient Prescriptions on File Prior to Visit  Medication Sig Dispense Refill  . AMBULATORY NON FORMULARY MEDICATION Pressure Vision/ Lutein- One tablet by mouth twice daily      . budesonide (ENTOCORT EC) 3 MG 24 hr capsule Take 3 capsules (9 mg total) by mouth every morning.  90 capsule  2  . clopidogrel (PLAVIX) 75 MG tablet Take 75 mg by mouth daily.      . diphenoxylate-atropine (LOMOTIL) 2.5-0.025 MG per tablet TAKE 1  TABLET BY MOUTH EVERY DAY FOR DIARRHEA  30 tablet  0  . famotidine (PEPCID) 20 MG tablet Take 20 mg by mouth at bedtime.        . fludrocortisone (FLORINEF) 0.1 MG tablet Take 1 tablet (0.1 mg total) by mouth daily.  60 tablet  0  . furosemide (LASIX) 20 MG tablet Take 20 mg by mouth 2 (two) times a week.      . memantine (NAMENDA) 10 MG tablet Take 10 mg by mouth 2 (two) times daily.      . Omega-3 Fatty Acids (FISH OIL) 1000 MG CAPS Take 2 capsules by mouth daily.        . rivastigmine (EXELON) 9.5 mg/24hr Place 1 patch onto the skin daily.        . vitamin B-12 (CYANOCOBALAMIN) 500 MCG tablet Take 500 mcg by mouth daily.       No current facility-administered medications on file prior to visit.    BP 84/50  Pulse 78  Temp(Src) 97.4 F (36.3 C) (Oral)  Resp 20  SpO2 90%     Review of Systems  Constitutional: Negative for  fever, chills, appetite change and fatigue.  HENT: Negative for hearing loss, ear pain, congestion, sore throat, trouble swallowing, neck stiffness, dental problem, voice change and tinnitus.   Eyes: Negative for pain, discharge and visual disturbance.  Respiratory: Negative for cough, chest tightness, wheezing and stridor.   Cardiovascular: Negative for chest pain, palpitations and leg swelling.  Gastrointestinal: Negative for nausea, vomiting, abdominal pain, diarrhea, constipation, blood in stool and abdominal distention.  Genitourinary: Negative for urgency, hematuria, flank pain, discharge, difficulty urinating and genital sores.  Musculoskeletal: Positive for gait problem. Negative for myalgias, back pain, joint swelling and arthralgias.  Skin: Negative for rash.  Neurological: Positive for tremors and weakness. Negative for dizziness, syncope, speech difficulty, numbness and headaches.  Hematological: Negative for adenopathy. Does not bruise/bleed easily.  Psychiatric/Behavioral: Positive for confusion and sleep disturbance. Negative for behavioral problems and dysphoric mood. The patient is not nervous/anxious.        Objective:   Physical Exam  Constitutional: He is oriented to person, place, and time. He appears well-developed and well-nourished. No distress.  Blood pressure 84/50 sitting 64/46 standing  HENT:  Head: Normocephalic.  Right Ear: External ear normal.  Left Ear: External ear normal.  Eyes: Conjunctivae and EOM are normal.  Neck: Normal range of motion.  Cardiovascular: Normal rate and normal heart sounds.   Irregular rhythm with controlled ventricular response  Pulmonary/Chest: He has rales.  Basilar rales right greater than left  Abdominal: Bowel sounds are normal.  Musculoskeletal: Normal range of motion. He exhibits no edema and no tenderness.  Neurological: He is alert and oriented to person, place, and time.  Psychiatric: He has a normal mood and affect.  His behavior is normal.          Assessment & Plan:   Parkinsonian syndrome with orthostatic hypotension. Patient has tolerated Florinef but hesitant to increase dose with history of heart failure and lack of symptoms at this time. Preference would be to add or substitute midodrine if needed for symptomatic hypotension.  Family really does not wish to consider additional medications at this time unless necessary. The patient was seen at the [facility] and apparently had normal early-morning cortisol levels performed. Dementia stable Chronic diarrhea

## 2013-02-08 NOTE — Patient Instructions (Signed)
Continue home physical therapy as scheduled  Neurology followup as scheduled  Return here in 2 months for followup  Call if  You  develop any back out spells or significant dizziness

## 2013-02-16 IMAGING — CR DG CHEST 2V
2 series · 2 of 2 positions shown · non-contrast
Comparison: Two-view chest x-ray 03/21/2011, 08/14/2009, 07/22/2009
[HOSPITAL] and 02/28/2005 [HOSPITAL].

CLINICAL DATA: 1-week history of shortness of breath and
generalized weakness.  Bilateral ankle swelling.

CHEST - 2 VIEW 08/05/2011:

[w chest pa]
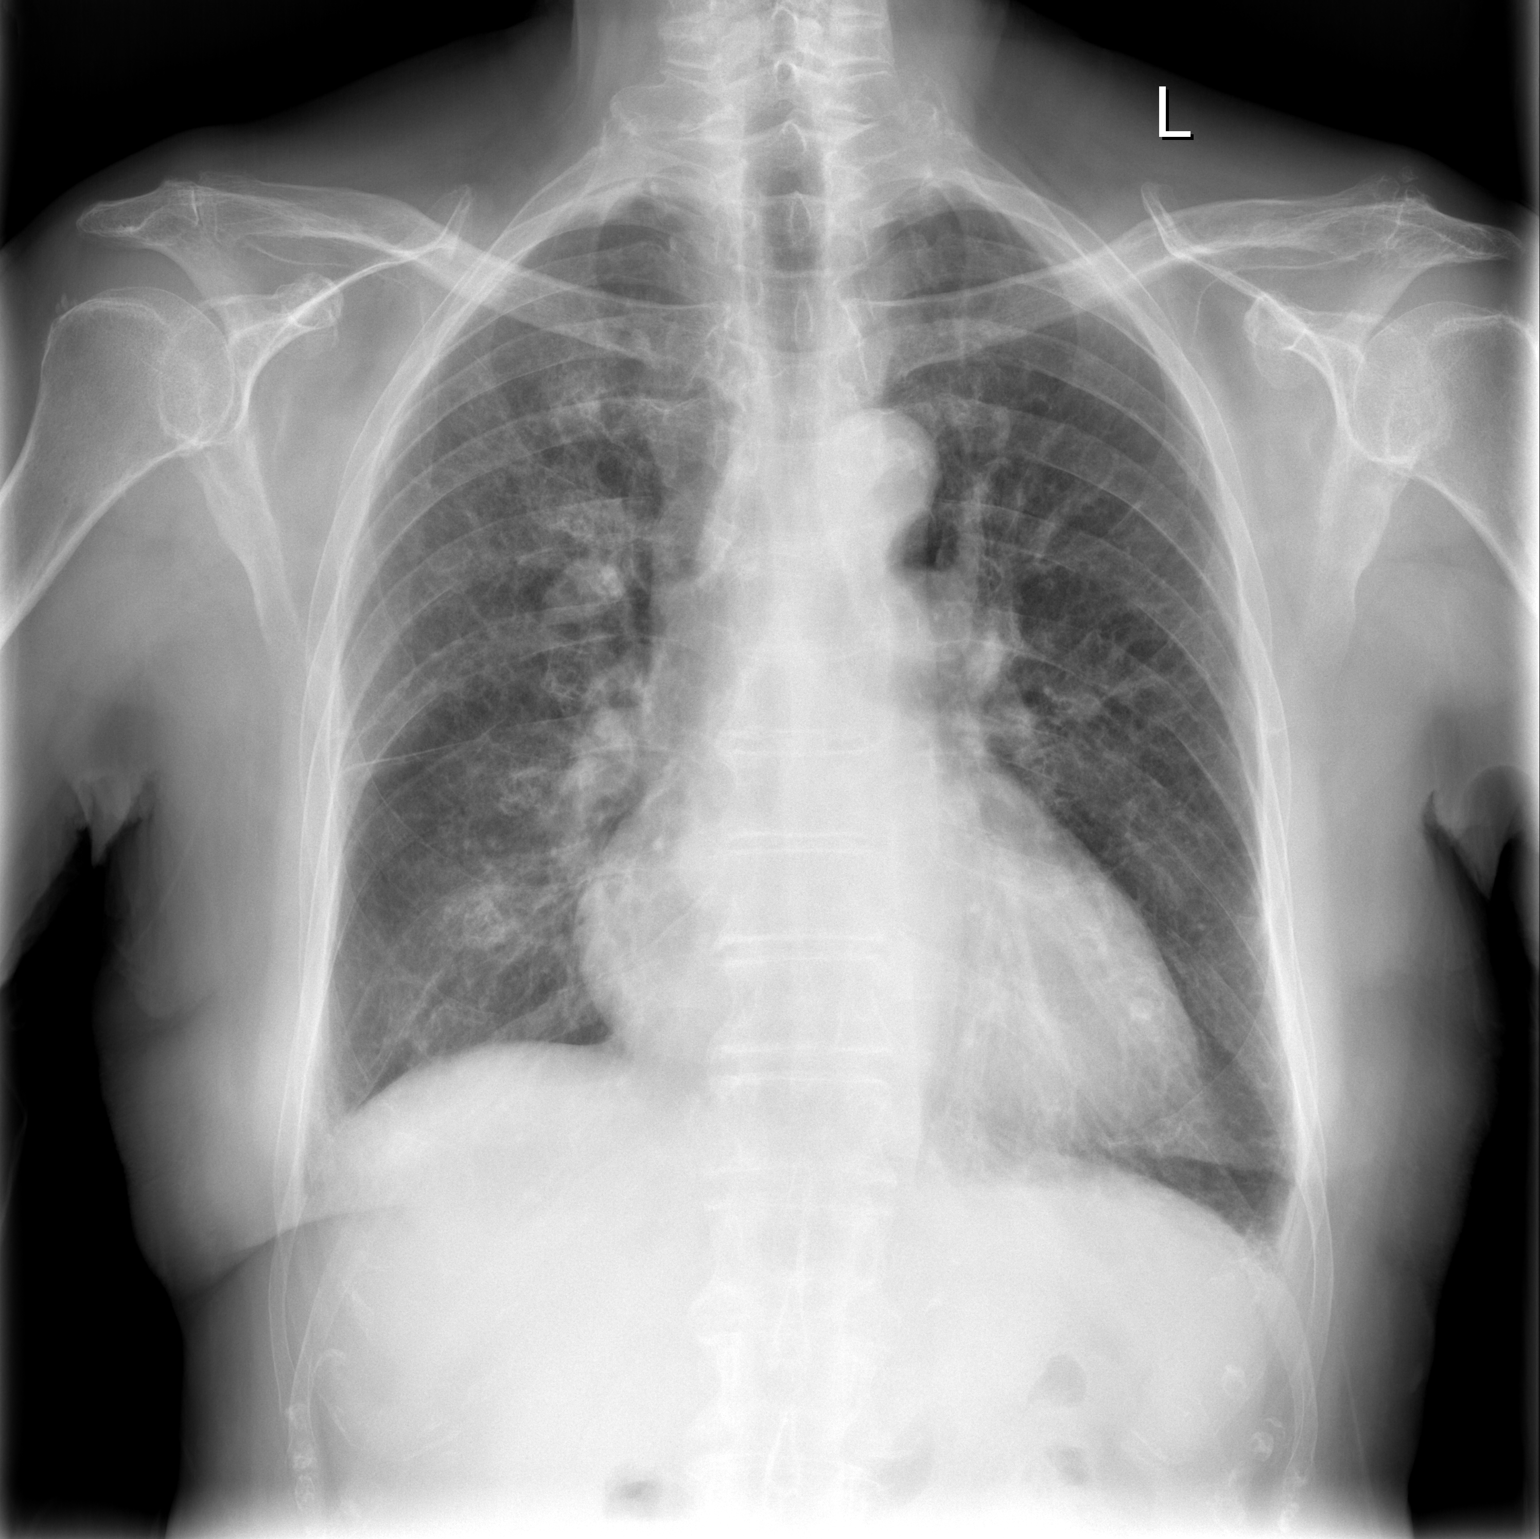

[w chest lat]
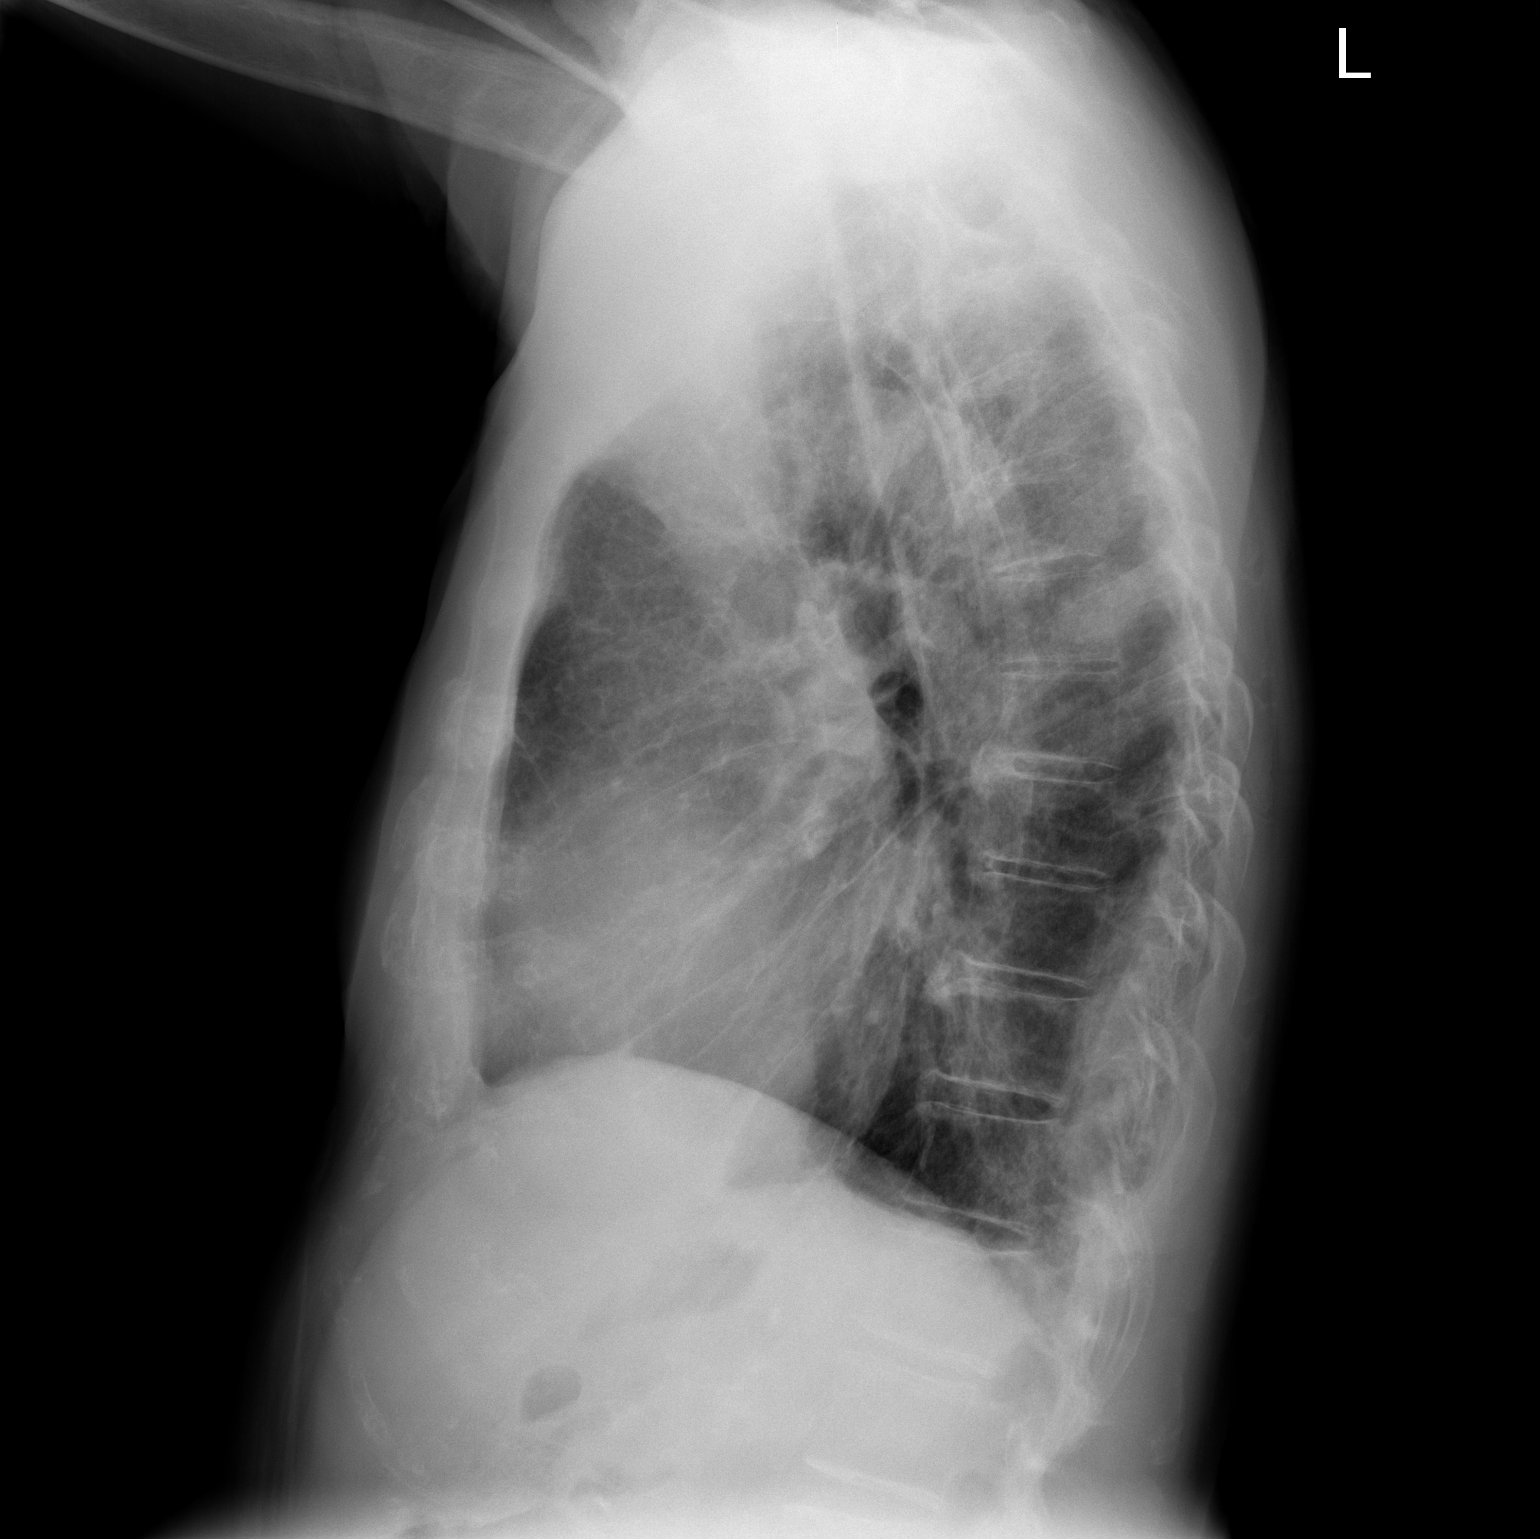

[2 of 2 positions shown; findings below may reference images not displayed]

FINDINGS: Cardiac silhouette enlarged, stable dating back to
June 2009, with slight increase in size since 9993.  Thoracic
aorta mildly tortuous and atherosclerotic, unchanged.  Hilar and
mediastinal contours otherwise unremarkable.  Interval development
of mild diffuse interstitial pulmonary edema, superimposed upon
baseline changes of COPD.  No confluent airspace consolidation.  No
pleural effusions.  Degenerative changes involving the thoracic
spine.
IMPRESSION: Mild CHF, with stable cardiomegaly and mild diffuse interstitial
pulmonary edema.

## 2013-02-20 ENCOUNTER — Telehealth: Payer: Self-pay | Admitting: Internal Medicine

## 2013-02-20 NOTE — Telephone Encounter (Signed)
Patient Information:  Caller Name: Nicholos Johns  Phone: [phone]  Patient: [name], [name]  Gender: Male  DOB: 03-Jun-1922  Age: 79 Years  PCP: Eleonore Chiquito [facility])  Office Follow Up:  Does the office need to follow up with this patient?: Yes  Instructions For The Office: Disposition is Office Now - appts are full.   Please see symptoms and advise.   Symptoms  Reason For Call & Symptoms: New med Florinef started 01/30/13 .  Stopped taking on 02/18/13 due to swelling in legs/ankles R>L, coughing, depressed, passing blood clots in stool size of nickle. (Family thinks med may be cause of problems)   02/12/13 Bright red blood clots started off smaller in size in stool, by end of three days, whole toilet bowl was bright red with blood and clots and also in his depends diapers.   Then pt stopped bleeding x 1 day.   The following day increased in amt over three days again. During this time period pt continued to have regular stools.  No bleeding from rectum since 02/19/13 AM, pt now having regular stools.   02/20/13 Afebrile, cough, shob at times, doing exercises with PT  Reviewed Health History In EMR: Yes  Reviewed Medications In EMR: Yes  Reviewed Allergies In EMR: Yes  Reviewed Surgeries / Procedures: Yes  Date of Onset of Symptoms: 02/12/2013  Guideline(s) Used:  Rectal Bleeding  Disposition Per Guideline:   Go to Office Now  Reason For Disposition Reached:   High-risk adult (e.g., prior surgery on aorta, abdominal aortic aneurysm)  Advice Given:  Call Back If:  Bleeding increases in amount  You become worse.  Patient Will Follow Care Advice:  YES

## 2013-02-20 NOTE — Telephone Encounter (Signed)
Please schedule follow up appointment this week.

## 2013-02-21 NOTE — Telephone Encounter (Signed)
Pt is scheduled for follow up on Friday.

## 2013-02-23 ENCOUNTER — Encounter: Payer: Self-pay | Admitting: Internal Medicine

## 2013-02-23 ENCOUNTER — Ambulatory Visit (INDEPENDENT_AMBULATORY_CARE_PROVIDER_SITE_OTHER): Payer: Federal, State, Local not specified - PPO | Admitting: Internal Medicine

## 2013-02-23 VITALS — BP 74/40 | HR 67 | Temp 97.6°F | Resp 18

## 2013-02-23 DIAGNOSIS — R319 Hematuria, unspecified: Secondary | ICD-10-CM

## 2013-02-23 DIAGNOSIS — I509 Heart failure, unspecified: Secondary | ICD-10-CM

## 2013-02-23 DIAGNOSIS — R351 Nocturia: Secondary | ICD-10-CM

## 2013-02-23 DIAGNOSIS — I5022 Chronic systolic (congestive) heart failure: Secondary | ICD-10-CM

## 2013-02-23 DIAGNOSIS — G20 Parkinson's disease: Secondary | ICD-10-CM

## 2013-02-23 DIAGNOSIS — G20A1 Parkinson's disease without dyskinesia, without mention of fluctuations: Secondary | ICD-10-CM

## 2013-02-23 DIAGNOSIS — I951 Orthostatic hypotension: Secondary | ICD-10-CM

## 2013-02-23 LAB — POCT URINALYSIS DIPSTICK
Ketones, UA: NEGATIVE
Spec Grav, UA: 1.025
pH, UA: 6.5

## 2013-02-23 MED ORDER — CIPROFLOXACIN HCL 500 MG PO TABS: 500.0000 mg | ORAL_TABLET | Freq: Two times a day (BID) | ORAL | Status: DC

## 2013-02-23 MED ORDER — MIDODRINE HCL 5 MG PO TABS: 5.0000 mg | ORAL_TABLET | Freq: Three times a day (TID) | ORAL | Status: DC

## 2013-02-23 NOTE — Patient Instructions (Signed)
Take your antibiotic as prescribed until ALL of it is gone, but stop if you develop a rash, swelling, or any side effects of the medication.  Contact our office as soon as possible if  there are side effects of the medication.  Neurology followup as planned  Return in one month for follow-up

## 2013-02-23 NOTE — Progress Notes (Signed)
  Subjective:    Patient ID: [name], male    DOB: [birth year], 79 y.o.   MRN: [id number]  HPI  79 year old patient who has a history of PVD with orthostatic hypotension and cognitive impairment. He was placed on Florinef a few weeks ago but was discontinued 4 days ago due to the onset of peripheral edema and suspected gross hematuria. According to the family he seemed to have a nice blood pressure response. Since discontinuation of medication his peripheral edema has improved. Today there has been a bit more lethargy.  Medical regimen includes Plavix. BP Readings from Last 3 Encounters:  02/23/13 74/40  02/08/13 84/50  01/29/13 100/58    Wt Readings from Last 3 Encounters:  02/08/13 149 lb 8 oz (67.813 kg)  01/18/13 148 lb (67.132 kg)  11/24/12 152 lb (68.947 kg)   UTI   Review of Systems  Constitutional: Positive for appetite change and fatigue. Negative for fever and chills.  HENT: Negative for hearing loss, ear pain, congestion, sore throat, trouble swallowing, neck stiffness, dental problem, voice change and tinnitus.   Eyes: Negative for pain, discharge and visual disturbance.  Respiratory: Negative for cough, chest tightness, wheezing and stridor.   Cardiovascular: Negative for chest pain, palpitations and leg swelling.  Gastrointestinal: Negative for nausea, vomiting, abdominal pain, diarrhea, constipation, blood in stool and abdominal distention.  Genitourinary: Negative for urgency, hematuria, flank pain, discharge, difficulty urinating and genital sores.  Musculoskeletal: Positive for gait problem. Negative for myalgias, back pain, joint swelling and arthralgias.  Skin: Negative for rash.  Neurological: Positive for tremors and weakness. Negative for dizziness, syncope, speech difficulty, numbness and headaches.  Hematological: Negative for adenopathy. Does not bruise/bleed easily.  Psychiatric/Behavioral: Negative for behavioral problems and dysphoric mood. The patient  is not nervous/anxious.        Objective:   Physical Exam  Constitutional: He is oriented to person, place, and time. He appears well-developed.  Elderly and appears chronically ill Tend to sit quietly in a wheelchair with his eyes closed but responds to verbal questions  Blood pressure 74/40 on arrival 90/50 repeated  HENT:  Head: Normocephalic.  Right Ear: External ear normal.  Left Ear: External ear normal.  Eyes: Conjunctivae and EOM are normal.  Neck: Normal range of motion.  Cardiovascular: Normal rate and normal heart sounds.   Pulmonary/Chest: Effort normal. He has rales.  Bilateral rales right greater than left  O2 saturation 93%  Abdominal: Bowel sounds are normal.  Musculoskeletal: Normal range of motion. He exhibits no edema and no tenderness.  Only trace the right ankle edema  Neurological: He is alert and oriented to person, place, and time.  Psychiatric: He has a normal mood and affect. His behavior is normal.          Assessment & Plan:     UTI will treat with cipro Orthostatic hypotension- will discuss midorine with family PD

## 2013-02-28 ENCOUNTER — Telehealth: Payer: Self-pay | Admitting: Internal Medicine

## 2013-02-28 NOTE — Telephone Encounter (Signed)
Pt is sch for tomorrow with Dr Caryl Never 230pm

## 2013-02-28 NOTE — Telephone Encounter (Signed)
Patient Information:  Caller Name: Olegario Messier  Phone: [phone]  Patient: Erskin, Zinda  Gender: Male  DOB: March 10, 1922  Age: 79 Years  PCP: Eleonore Chiquito [facility])  Office Follow Up:  Does the office need to follow up with this patient?: Yes  Instructions For The Office: Please call back ASAP regarding work in appointmen 6/4/14t.  Needs 10 minutes to get to office.  RN Note:  No appointments remain in office for 02/28/13. Information sent to office staff for call back ASAP regarding work- in appointment.    Symptoms  Reason For Call & Symptoms: Completed 3 day Cipro treatment 02/27/13 for UTI. Continues to report bilateral flank and low back pain especially after voiding or when stands. Urine has some odor and darker color.  No hematuria.  Wears depends. Voids every 2-3 hours during the night. Been sleepy and irritable for past 2 days.    Asking how to know if infection is gone?     Also started Midodrine 5 mg TID to > BP. Instructions say not to take it within 4 hours before bedtime and not to take when lying down.  Does not get up until 1230; goes to bed about 2200.  Asking how administer 3 doses in the remaining 6 hours of wakefulness.   Reviewed Health History In EMR: Yes  Reviewed Medications In EMR: Yes  Reviewed Allergies In EMR: Yes  Reviewed Surgeries / Procedures: Yes  Date of Onset of Symptoms: 02/28/2013  Treatments Tried: increased fluids  Treatments Tried Worked: No  Guideline(s) Used:  Urination Pain - Male  Disposition Per Guideline:   Go to Office Now  Reason For Disposition Reached:   Side (flank) or lower back pain present  Advice Given:  Fluids  : Drink extra fluids (Reason: to produce a dilute, nonirritating urine).  Call Back If:  You become worse.  Patient Will Follow Care Advice:  YES

## 2013-02-28 NOTE — Telephone Encounter (Signed)
Please call pt and offer appt for tomorrow.

## 2013-03-01 ENCOUNTER — Ambulatory Visit (INDEPENDENT_AMBULATORY_CARE_PROVIDER_SITE_OTHER): Payer: Federal, State, Local not specified - PPO | Admitting: Family Medicine

## 2013-03-01 ENCOUNTER — Encounter: Payer: Self-pay | Admitting: Family Medicine

## 2013-03-01 VITALS — BP 94/60 | HR 100 | Temp 97.8°F | Resp 18 | Wt 145.0 lb

## 2013-03-01 DIAGNOSIS — R300 Dysuria: Secondary | ICD-10-CM

## 2013-03-01 LAB — POCT URINALYSIS DIPSTICK
Bilirubin, UA: NEGATIVE
Blood, UA: NEGATIVE
Glucose, UA: NEGATIVE
Ketones, UA: NEGATIVE
Nitrite, UA: NEGATIVE

## 2013-03-01 NOTE — Progress Notes (Signed)
  Subjective:    Patient ID: [name], male    DOB: 1922-08-19, 79 y.o.   MRN: [id number]  HPI Patient seen for followup UTI Treated with Cipro. Family thinks his urinary symptoms have probably improved. Difficult to assess because of advanced dementia. He does complain of some low back pain and receiving physical therapy.  No recent injury/fall. Minimally ambulatory. No reported fevers or chills.  History of orthostasis with recent initiation of midodrine. Family feel his fluid intake has been fair Family mostly wanted to check to see that his urine infection recently has cleared  Past Medical History  Diagnosis Date  . DEPRESSION 04/03/2007  . Diarrhea 06/20/2008  . DIVERTICULITIS, HX OF 04/03/2007  . GERD 08/31/2007  . HYPERTENSION 04/03/2007    now with hypotension  . MILD COGNITIVE IMPAIRMENT SO STATED 08/01/2008  . NEPHROLITHIASIS, HX OF 04/03/2007  . Neurasthenia 03/10/2009  . OSTEOPOROSIS 08/31/2007  . PROSTATE CANCER, HX OF 04/02/1997  . RADIATION PROCTITIS 01/14/2010  . SKIN CANCER, HX OF 04/03/2007    Had some removed   . TRANSIENT ISCHEMIC ATTACK 08/14/2009  . Lymphocytic colitis   . History of colon cancer 1960  . CHF (congestive heart failure)    Past Surgical History  Procedure Laterality Date  . Hemicolectomy  1960's  . Lumbar laminectomy  2006  . Prostate biopsy  1999  . Empyema drainage  1934  . Inguinal hernia repair  4098,1191  . Eye surgery  2009    Both eyes  . Rib resection    . Cataract extraction, bilateral  11/2008    reports that he quit smoking about 33 years ago. He has never used smokeless tobacco. He reports that  drinks alcohol. He reports that he does not use illicit drugs. family history includes Heart attack (age of onset: 83) in his father and Liver cancer (age of onset: 74) in his mother.  There is no history of Colon cancer. No Known Allergies    Review of Systems  Constitutional: Negative for fever and chills.  Respiratory: Negative for  cough.   Gastrointestinal: Negative for nausea and vomiting.       Objective:   Physical Exam  Constitutional: He appears well-developed and well-nourished.  Cardiovascular: Normal rate and regular rhythm.   Pulmonary/Chest: Effort normal and breath sounds normal. No respiratory distress. He has no wheezes. He has no rales.  Neurological:  Ambulating without support.          Assessment & Plan:  Recent UTI. Urine dipstick today significantly improved. Does have trace leukocytes which is probably insignificant. Urine culture sent. Encouraged good fluid intake. Followup with primary as scheduled

## 2013-03-01 NOTE — Patient Instructions (Addendum)
Follow up promptly for any fever or new symptoms.

## 2013-03-03 LAB — URINE CULTURE: Colony Count: 40000

## 2013-03-05 ENCOUNTER — Other Ambulatory Visit: Payer: Self-pay | Admitting: *Deleted

## 2013-03-05 MED ORDER — DOXYCYCLINE HYCLATE 100 MG PO TABS: 100.0000 mg | ORAL_TABLET | Freq: Two times a day (BID) | ORAL | Status: DC

## 2013-03-05 NOTE — Progress Notes (Signed)
Quick Note:  Pt wife informed, Rx sent ______ 

## 2013-03-12 ENCOUNTER — Encounter: Payer: Self-pay | Admitting: Neurology

## 2013-03-12 ENCOUNTER — Ambulatory Visit (INDEPENDENT_AMBULATORY_CARE_PROVIDER_SITE_OTHER): Payer: Federal, State, Local not specified - PPO | Admitting: Neurology

## 2013-03-12 VITALS — BP 100/68 | HR 84 | Resp 18 | Wt 149.0 lb

## 2013-03-12 DIAGNOSIS — I951 Orthostatic hypotension: Secondary | ICD-10-CM

## 2013-03-12 DIAGNOSIS — R488 Other symbolic dysfunctions: Secondary | ICD-10-CM

## 2013-03-12 DIAGNOSIS — R482 Apraxia: Secondary | ICD-10-CM

## 2013-03-12 DIAGNOSIS — G219 Secondary parkinsonism, unspecified: Secondary | ICD-10-CM

## 2013-03-12 DIAGNOSIS — G4752 REM sleep behavior disorder: Secondary | ICD-10-CM

## 2013-03-12 NOTE — Patient Instructions (Signed)
Follow in six months.

## 2013-03-12 NOTE — Progress Notes (Signed)
[name] was seen today in the movement disorders clinic for neurologic consultation at the request of Rogelia Boga, MD.  The consultation is for the evaluation of parkinsonism and dementia.  He is accompanied by wife and son who supplement the hx.     The pt was dx with Parkinsonism 3 years ago when he had a TIA.  He went to the hospital b/c he was having trouble moving and had collapsed in the bathroom.  He was conscious but his had facial droop (unknown side).  Sx's lasted a few hours.  He did not have tremor at that point in time but he had shuffling gait prior to that.  He was evaluated in the hospital and told by the neurologist that he had parkinsonism.  There was no outside f/u with a neurologist.  I reviewed in November, 2010 records that were made available to me.  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  I do not see any mention of parkinsonism.  Several weeks ago, the patient had an episode of closing eyes, fully conscious but not responding appropriately.  No etiology was found.    03/12/13 Update  The patient presents today with his family for a followup.  Because of significant orthostatic hypotension, his primary care physician cautiously tried Florinef.  The patient did develop peripheral edema (mild per son/wife) and unrelated hematuria that turned out to be due to urinary tract infection.  Nonetheless, the Florinef was discontinued and he was ultimately started on Midodrine.  He is on no medication for parkinsonism because of the significant orthostatic hypotension. Fortunately, since being placed on the midodrine, he has been markedly better.  His son states that 90% of the time he is able to walk and is no longer in the wheelchair.  He does have someone walking next to him most of the time.  He has done much better with the hospital bed in terms of getting out of bed because of the rails.  He does not like it, however and  does try to get out.  His wife and son have installed cameras in the room and can't see when he tries to get up.  A bed alarm did not work for them, as it woke the whole family up.  He has not fallen since last visit.  He has had no hallucinations.  He has had no passing out episodes and no episodes of near syncope.  He has had no hallucinations.  Neuroimaging has  previously been performed.  It is available for my review today.  I reviewed his MRI of the brain without gadolinium from 2010.  There was marked atrophy, both central and peripheral.  There was a moderate amount of small vessel disease scattered throughout the subcortical white matter.  This was not significant in the basal ganglia.  A CT from April, 2014 showed continued atrophy.  *RADIOLOGY REPORT*   Clinical Data: 79 year old male with altered mental status.   CT HEAD WITHOUT CONTRAST   Technique:  Contiguous axial images were obtained from the base of the skull through the vertex without contrast.   Comparison: 09/10/2011 CT   Findings: Mild generalized cerebral volume loss and mild chronic small vessel white matter ischemic changes again noted.   No acute intracranial abnormalities are identified, including mass lesion or mass effect, hydrocephalus, extra-axial fluid collection, midline shift, hemorrhage, or acute infarction.   The visualized bony calvarium is unremarkable.   IMPRESSION: No  evidence of acute intracranial abnormality. Clinical Data:  Weakness.  Slurred speech.  Left facial droop.    MRI HEAD WITHOUT CONTRAST MRA HEAD WITHOUT CONTRAST    Technique: Multiplanar, multiecho pulse sequences of the brain and surrounding structures were obtained according to standard protocol without intravenous contrast.  Angiographic images of the head were obtained using MRA technique without contrast.    Comparison: No comparison MR.  Prior head CT 08/14/2009.    MRI HEAD    Findings:  Small acute non hemorrhagic  infarcts scattered throughout the right hemisphere involving portions of the right frontal lobe, right parietal lobe, right occipital lobe and right temporal lobe.    No intracranial hemorrhage.  Moderate small vessel disease type changes.  Global atrophy without hydrocephalus.  No intracranial mass lesion detected on this unenhanced exam.  Minimal paranasal sinus mucosal thickening.    Mild transverse ligament is feet.  Minimal bulge C3-4.    IMPRESSION: Scattered small acute non hemorrhagic infarcts throughout the right hemisphere as noted above.    MRA HEAD    Findings: Anterior circulation without medium or large size vessel significant stenosis or occlusion.    Left vertebral artery is dominant in size.  No significant narrowing of the basilar artery.  Mild branch vessel irregularity. No aneurysm noted.    IMPRESSION: Mild intracranial atherosclerotic type changes as discussed above.   PREVIOUS MEDICATIONS: none to date  ALLERGIES:  No Known Allergies  CURRENT MEDICATIONS:  Current Outpatient Prescriptions on File Prior to Visit  Medication Sig Dispense Refill  . AMBULATORY NON FORMULARY MEDICATION Pressure Vision/ Lutein- One tablet by mouth twice daily      . budesonide (ENTOCORT EC) 3 MG 24 hr capsule Take 3 capsules (9 mg total) by mouth every morning.  90 capsule  2  . clopidogrel (PLAVIX) 75 MG tablet Take 75 mg by mouth daily.      . diphenoxylate-atropine (LOMOTIL) 2.5-0.025 MG per tablet TAKE 1 TABLET BY MOUTH EVERY DAY FOR DIARRHEA  90 tablet  0  . doxycycline (VIBRA-TABS) 100 MG tablet Take 1 tablet (100 mg total) by mouth 2 (two) times daily.  20 tablet  0  . memantine (NAMENDA) 10 MG tablet Take 10 mg by mouth 2 (two) times daily.      . midodrine (PROAMATINE) 5 MG tablet Take 1 tablet (5 mg total) by mouth 3 (three) times daily.  90 tablet  2  . Omega-3 Fatty Acids (FISH OIL) 1000 MG CAPS Take 2 capsules by mouth daily.        . rivastigmine (EXELON) 9.5  mg/24hr Place 1 patch onto the skin daily.        . vitamin B-12 (CYANOCOBALAMIN) 500 MCG tablet Take 500 mcg by mouth daily.       No current facility-administered medications on file prior to visit.    PAST MEDICAL HISTORY:   Past Medical History  Diagnosis Date  . DEPRESSION 04/03/2007  . Diarrhea 06/20/2008  . DIVERTICULITIS, HX OF 04/03/2007  . GERD 08/31/2007  . HYPERTENSION 04/03/2007    now with hypotension  . MILD COGNITIVE IMPAIRMENT SO STATED 08/01/2008  . NEPHROLITHIASIS, HX OF 04/03/2007  . Neurasthenia 03/10/2009  . OSTEOPOROSIS 08/31/2007  . PROSTATE CANCER, HX OF 04/02/1997  . RADIATION PROCTITIS 01/14/2010  . SKIN CANCER, HX OF 04/03/2007    Had some removed   . TRANSIENT ISCHEMIC ATTACK 08/14/2009  . Lymphocytic colitis   . History of colon cancer 1960  . CHF (congestive heart  failure)     PAST SURGICAL HISTORY:   Past Surgical History  Procedure Laterality Date  . Hemicolectomy  1960's  . Lumbar laminectomy  2006  . Prostate biopsy  1999  . Empyema drainage  1934  . Inguinal hernia repair  [id number]  . Eye surgery  2009    Both eyes  . Rib resection    . Cataract extraction, bilateral  11/2008    SOCIAL HISTORY:   History   Social History  . Marital Status: Married    Spouse Name: N/A    Number of Children: N/A  . Years of Education: N/A   Occupational History  . Retired     Counsellor   Social History Main Topics  . Smoking status: Former Smoker    Quit date: 04/07/1979  . Smokeless tobacco: Never Used  . Alcohol Use: Yes     Comment: 1 glass wine every 2-3 weeks  . Drug Use: No  . Sexually Active: Not on file   Other Topics Concern  . Not on file   Social History Narrative  . No narrative on file    FAMILY HISTORY:   Family Status  Relation Status Death Age  . Mother Deceased     [place]  . Father Deceased     [place]  . Brother Deceased     3, [place], [place]  . Sister Deceased     [place]  . Child Alive     6, healthy    ROS:  A complete 10  system review of systems was obtained and was unremarkable apart from what is mentioned above.  PHYSICAL EXAMINATION:    VITALS:   Filed Vitals:   03/12/13 1353  BP: 100/68  Pulse: 84  Resp: 18  Weight: 149 lb (67.586 kg)    GEN:  The patient appears stated age and is in NAD. HEENT:  Normocephalic, atraumatic.  The mucous membranes are moist. The superficial temporal arteries are without ropiness or tenderness. CV:  RRR Lungs:  CTAB Neck/HEME:  There are no carotid bruits bilaterally.  Neurological examination:  Orientation: A complete MMSE was performed last visit and the pt scored a 10/30.  Today, the patient relies mostly on his son and wife to provide accurate history.  He does not remember much of what has gone on. Cranial nerves: There is good facial symmetry.  There is almost no eyelid opening apraxia seen today.  Pupils are equal round and reactive to light bilaterally. Fundoscopic exam is attempted but the disc margins are not well visualized bilaterally.  Extraocular muscles are intact.  He has difficulty participating with formal confrontational visual field testing, but the visual fields do appear to be intact with visual minutes. The speech is fluent and clear but hypophonic. Soft palate rises symmetrically and there is no tongue deviation. Hearing is intact to conversational tone. Sensation: The patient has difficulty participating with a sensitive aspects of the sensory examination, but sensation does appear to be symmetric and no sensory dermatomal level was identified. Motor: Strength is 5/5 in the bilateral upper and lower extremities.   Shoulder shrug is equal and symmetric.  There is no pronator drift. Deep tendon reflexes: Deep tendon reflexes are 0-1 at the bilateral biceps, triceps, brachioradialis, patella and achilles. Plantar responses are downgoing bilaterally.  Movement examination: Tone: There is mildly increased tone in the bilateral upper extremities.  There  is, however, a gegenhalten quality. The tone in the lower extremities is normal.  Abnormal  movements: There is no tremor present today. Coordination:  There is  decremation with RAM's, most significantly with hand opening and closing and finger taps bilaterally.  He was able to perform heel taps and toe taps. Gait and Station: The patient requires minimal assistance to get out of the chair today.  He is very short stepped through the doorways, but once he gets moving he walks quite well.  He does turn en bloc.   ASSESSMENT/PLAN:  1.  Parkinsonism.  -Because the patient and his family have difficulty with remembering the exact sequence of events in terms of symptom progression, it is very good difficult to diagnose this as moderate to moderately advanced Parkinson's or one of the other parkinsonian states, such as frontotemporal dementia.  Today, I am leaning towards frontotemporal dementia. I do not think he has Lewy body dementia, as hallucinations were not and are not a prominent feature and do not think that he has NPH, as he has both central and peripheral atrophy.  I did review November, 2010 records available to me and did not see any diagnosis of Parkinson's or any parkinsonian states.  -The orthostatic hypotension is markedly improved on midodrine.  -We did decide to hold on the levodopa.  This is primarily because it can contribute to further orthostatic hypotension and he is doing well now.  We can certainly revisit this in the future.  -He will continue with home physical therapy.  I stressed the importance of a regular cardiovascular exercise program. 2.  REM behavior disorder.  -This is common with alpha synucleinopathies.  His wife has been concerned about this, because he has been getting out of bed in the middle of the night and falling over the last several weeks.  Right now, however, they do not wish to try the clonazepam as just today had a hospital bed delivery and are hoping that  having the rails will provide his safety mechanism without any medication. 3.  Dementia.  -He is already on Exelon patch, 9.5 mg daily and is having no problems with this.  I agree with this.  He is also on Namenda at, 10 mg twice a day and has been on several years without a problem.  -He is having some behavioral outbursts, and if this becomes a problem then I would recommend Seroquel.  We talked about this today, but decided to hold off. 4.  eyelid opening apraxia.  -This has actually improved somewhat.  If it becomes worse and interferes with his activities of daily living, including walking, then we can do Botox, which would be of great value. 5. I will schedule a f/u in the next 6 months.

## 2013-03-26 ENCOUNTER — Ambulatory Visit (INDEPENDENT_AMBULATORY_CARE_PROVIDER_SITE_OTHER): Payer: Federal, State, Local not specified - PPO | Admitting: Internal Medicine

## 2013-03-26 ENCOUNTER — Encounter: Payer: Self-pay | Admitting: Internal Medicine

## 2013-03-26 VITALS — BP 100/64 | HR 62 | Temp 97.5°F | Resp 18 | Wt 150.0 lb

## 2013-03-26 DIAGNOSIS — H6122 Impacted cerumen, left ear: Secondary | ICD-10-CM

## 2013-03-26 DIAGNOSIS — I951 Orthostatic hypotension: Secondary | ICD-10-CM

## 2013-03-26 DIAGNOSIS — I5022 Chronic systolic (congestive) heart failure: Secondary | ICD-10-CM

## 2013-03-26 DIAGNOSIS — G219 Secondary parkinsonism, unspecified: Secondary | ICD-10-CM

## 2013-03-26 DIAGNOSIS — H6120 Impacted cerumen, unspecified ear: Secondary | ICD-10-CM

## 2013-03-26 DIAGNOSIS — G3184 Mild cognitive impairment, so stated: Secondary | ICD-10-CM

## 2013-03-26 DIAGNOSIS — I509 Heart failure, unspecified: Secondary | ICD-10-CM

## 2013-03-26 MED ORDER — MEMANTINE HCL 10 MG PO TABS: 10.0000 mg | ORAL_TABLET | Freq: Two times a day (BID) | ORAL | Status: DC

## 2013-03-26 MED ORDER — BUDESONIDE 3 MG PO CP24: 9.0000 mg | ORAL_CAPSULE | ORAL | Status: DC

## 2013-03-26 MED ORDER — RIVASTIGMINE 9.5 MG/24HR TD PT24: 1.0000 | MEDICATED_PATCH | Freq: Every day | TRANSDERMAL | Status: DC

## 2013-03-26 NOTE — Patient Instructions (Signed)
Limit your sodium (Salt) intake  Return in 3 months for follow-up   

## 2013-03-26 NOTE — Progress Notes (Signed)
Subjective:    Patient ID: [name], male    DOB: Mar 11, 1922, 79 y.o.   MRN: [id number]  HPI 79 year old patient who is seen today in followup of the ; he is followed closely by neurology  Do to Parkinson's disease and associated orthostatic hypotension.  He has done quite well and no longer uses a wheelchair. There has been no syncopal episodes or falls recently. In general doing much better. He has a history of mild dementia and depression. Family describes some increased irritability but in general doing fairly well. She has been on Lexapro in the past.  Family also describes decreased auditory acuity  Past Medical History  Diagnosis Date  . DEPRESSION 04/03/2007  . Diarrhea 06/20/2008  . DIVERTICULITIS, HX OF 04/03/2007  . GERD 08/31/2007  . HYPERTENSION 04/03/2007    now with hypotension  . MILD COGNITIVE IMPAIRMENT SO STATED 08/01/2008  . NEPHROLITHIASIS, HX OF 04/03/2007  . Neurasthenia 03/10/2009  . OSTEOPOROSIS 08/31/2007  . PROSTATE CANCER, HX OF 04/02/1997  . RADIATION PROCTITIS 01/14/2010  . SKIN CANCER, HX OF 04/03/2007    Had some removed   . TRANSIENT ISCHEMIC ATTACK 08/14/2009  . Lymphocytic colitis   . History of colon cancer 1960  . CHF (congestive heart failure)     History   Social History  . Marital Status: Married    Spouse Name: N/A    Number of Children: N/A  . Years of Education: N/A   Occupational History  . Retired     Counsellor   Social History Main Topics  . Smoking status: Former Smoker    Quit date: 04/07/1979  . Smokeless tobacco: Never Used  . Alcohol Use: Yes     Comment: 1 glass wine every 2-3 weeks  . Drug Use: No  . Sexually Active: Not on file   Other Topics Concern  . Not on file   Social History Narrative  . No narrative on file    Past Surgical History  Procedure Laterality Date  . Hemicolectomy  1960's  . Lumbar laminectomy  2006  . Prostate biopsy  1999  . Empyema drainage  1934  . Inguinal hernia repair   [id number]  . Eye surgery  2009    Both eyes  . Rib resection    . Cataract extraction, bilateral  11/2008    Family History  Problem Relation Age of Onset  . Heart attack Father 40  . Liver cancer Mother 38  . Colon cancer Neg Hx     No Known Allergies  Current Outpatient Prescriptions on File Prior to Visit  Medication Sig Dispense Refill  . AMBULATORY NON FORMULARY MEDICATION Pressure Vision/ Lutein- One tablet by mouth twice daily      . clopidogrel (PLAVIX) 75 MG tablet Take 75 mg by mouth daily.      . midodrine (PROAMATINE) 5 MG tablet Take 1 tablet (5 mg total) by mouth 3 (three) times daily.  90 tablet  2  . Omega-3 Fatty Acids (FISH OIL) 1000 MG CAPS Take 2 capsules by mouth daily.        . vitamin B-12 (CYANOCOBALAMIN) 500 MCG tablet Take 500 mcg by mouth daily.       No current facility-administered medications on file prior to visit.    BP 100/64  Pulse 62  Temp(Src) 97.5 F (36.4 C) (Oral)  Resp 18  Wt 150 lb (68.04 kg)  BMI 22.81 kg/m2  SpO2 95%      Review  of Systems  Constitutional: Positive for fatigue. Negative for fever, chills and appetite change.  HENT: Negative for hearing loss, ear pain, congestion, sore throat, trouble swallowing, neck stiffness, dental problem, voice change and tinnitus.   Eyes: Negative for pain, discharge and visual disturbance.  Respiratory: Negative for cough, chest tightness, wheezing and stridor.   Cardiovascular: Negative for chest pain, palpitations and leg swelling.  Gastrointestinal: Negative for nausea, vomiting, abdominal pain, diarrhea, constipation, blood in stool and abdominal distention.  Genitourinary: Negative for urgency, hematuria, flank pain, discharge, difficulty urinating and genital sores.  Musculoskeletal: Positive for back pain and arthralgias. Negative for myalgias, joint swelling and gait problem.  Skin: Negative for rash.  Neurological: Negative for dizziness, syncope, speech difficulty, weakness,  numbness and headaches.  Hematological: Negative for adenopathy. Does not bruise/bleed easily.  Psychiatric/Behavioral: Positive for confusion, sleep disturbance and decreased concentration. Negative for behavioral problems and dysphoric mood. The patient is not nervous/anxious.        Objective:   Physical Exam  Constitutional: He is oriented to person, place, and time. He appears well-developed.  Blood pressure 100/60  HENT:  Head: Normocephalic.  Right Ear: External ear normal.  Left Ear: External ear normal.  Eyes: Conjunctivae and EOM are normal.  Neck: Normal range of motion.  Cardiovascular: Normal rate and normal heart sounds.   Pulmonary/Chest: Effort normal. He has rales.  Abdominal: Bowel sounds are normal.  Musculoskeletal: Normal range of motion. He exhibits no edema and no tenderness.  Neurological: He is alert and oriented to person, place, and time.  Psychiatric: He has a normal mood and affect. His behavior is normal.          Assessment & Plan:  Parkinson's disease Orthostatic hypotension. Continue present therapy Cerumen impaction. Will irrigate left canal until clear Congestive heart failure stable

## 2013-03-26 NOTE — Progress Notes (Signed)
  Subjective:    Patient ID: ZYAIR RUSSI, male    DOB: Jun 18, 1922, 79 y.o.   MRN: [id number]  HPI  Wt Readings from Last 3 Encounters:  03/26/13 150 lb (68.04 kg)  03/12/13 149 lb (67.586 kg)  03/01/13 145 lb (65.772 kg)    BP Readings from Last 3 Encounters:  03/26/13 100/64  03/12/13 100/68  03/01/13 94/60    Review of Systems     Objective:   Physical Exam        Assessment & Plan:

## 2013-03-27 ENCOUNTER — Other Ambulatory Visit: Payer: Self-pay | Admitting: Internal Medicine

## 2013-04-10 ENCOUNTER — Ambulatory Visit: Payer: Federal, State, Local not specified - PPO | Admitting: Internal Medicine

## 2013-06-26 ENCOUNTER — Ambulatory Visit (INDEPENDENT_AMBULATORY_CARE_PROVIDER_SITE_OTHER): Payer: Federal, State, Local not specified - PPO | Admitting: Internal Medicine

## 2013-06-26 ENCOUNTER — Encounter: Payer: Self-pay | Admitting: Internal Medicine

## 2013-06-26 VITALS — BP 118/64 | HR 84 | Temp 97.4°F | Resp 20 | Wt 148.2 lb

## 2013-06-26 DIAGNOSIS — G219 Secondary parkinsonism, unspecified: Secondary | ICD-10-CM

## 2013-06-26 DIAGNOSIS — I951 Orthostatic hypotension: Secondary | ICD-10-CM

## 2013-06-26 DIAGNOSIS — Z23 Encounter for immunization: Secondary | ICD-10-CM

## 2013-06-26 DIAGNOSIS — I509 Heart failure, unspecified: Secondary | ICD-10-CM

## 2013-06-26 NOTE — Progress Notes (Signed)
Subjective:    Patient ID: [name], male    DOB: 02-Aug-1922, 79 y.o.   MRN: [id number]  HPI  79 year old patient who is seen today for his quarterly followup;  he has a history of Parkinson's disease with orthostatic hypotension. He has done reasonably well over the past 3 months.  He requires a considerable encouragement to get out of bed and be active. He goes to bed at 10 PM and gets up at 2 PM.  He has done better on midodrine for orthostatic hypotension. He continues to have episodes of freezing but these are very rare. No falls or syncope. No symptoms of congestive heart failure.  Past Medical History  Diagnosis Date  . DEPRESSION 04/03/2007  . Diarrhea 06/20/2008  . DIVERTICULITIS, HX OF 04/03/2007  . GERD 08/31/2007  . HYPERTENSION 04/03/2007    now with hypotension  . MILD COGNITIVE IMPAIRMENT SO STATED 08/01/2008  . NEPHROLITHIASIS, HX OF 04/03/2007  . Neurasthenia 03/10/2009  . OSTEOPOROSIS 08/31/2007  . PROSTATE CANCER, HX OF 04/02/1997  . RADIATION PROCTITIS 01/14/2010  . SKIN CANCER, HX OF 04/03/2007    Had some removed   . TRANSIENT ISCHEMIC ATTACK 08/14/2009  . Lymphocytic colitis   . History of colon cancer 1960  . CHF (congestive heart failure)     History   Social History  . Marital Status: Married    Spouse Name: N/A    Number of Children: N/A  . Years of Education: N/A   Occupational History  . Retired     Counsellor   Social History Main Topics  . Smoking status: Former Smoker    Quit date: 04/07/1979  . Smokeless tobacco: Never Used  . Alcohol Use: Yes     Comment: 1 glass wine every 2-3 weeks  . Drug Use: No  . Sexual Activity: Not on file   Other Topics Concern  . Not on file   Social History Narrative  . No narrative on file    Past Surgical History  Procedure Laterality Date  . Hemicolectomy  1960's  . Lumbar laminectomy  2006  . Prostate biopsy  1999  . Empyema drainage  1934  . Inguinal hernia repair  [id number]  . Eye surgery   2009    Both eyes  . Rib resection    . Cataract extraction, bilateral  11/2008    Family History  Problem Relation Age of Onset  . Heart attack Father 38  . Liver cancer Mother 33  . Colon cancer Neg Hx     No Known Allergies  Current Outpatient Prescriptions on File Prior to Visit  Medication Sig Dispense Refill  . AMBULATORY NON FORMULARY MEDICATION Pressure Vision/ Lutein- One tablet by mouth twice daily      . budesonide (ENTOCORT EC) 3 MG 24 hr capsule Take 3 capsules (9 mg total) by mouth every morning.  90 capsule  3  . clopidogrel (PLAVIX) 75 MG tablet Take 75 mg by mouth daily.      . diphenoxylate-atropine (LOMOTIL) 2.5-0.025 MG per tablet Take 1 tablet by mouth daily.       Marland Kitchen NAMENDA 10 MG tablet TAKE 1 TABLET BY MOUTH TWICE DAILY  180 tablet  0  . Omega-3 Fatty Acids (FISH OIL) 1000 MG CAPS Take 2 capsules by mouth daily.        . rivastigmine (EXELON) 9.5 mg/24hr Place 1 patch (9.5 mg total) onto the skin daily.  30 patch  1  . vitamin  B-12 (CYANOCOBALAMIN) 500 MCG tablet Take 500 mcg by mouth daily.       No current facility-administered medications on file prior to visit.    BP 118/64  Pulse 84  Temp(Src) 97.4 F (36.3 C) (Oral)  Resp 20  Wt 148 lb 3.2 oz (67.223 kg)  BMI 22.54 kg/m2  SpO2 96%       Review of Systems  Constitutional: Negative for fever, chills, appetite change and fatigue.  HENT: Negative for hearing loss, ear pain, congestion, sore throat, trouble swallowing, neck stiffness, dental problem, voice change and tinnitus.   Eyes: Negative for pain, discharge and visual disturbance.  Respiratory: Negative for cough, chest tightness, wheezing and stridor.   Cardiovascular: Negative for chest pain, palpitations and leg swelling.  Gastrointestinal: Negative for nausea, vomiting, abdominal pain, diarrhea, constipation, blood in stool and abdominal distention.  Genitourinary: Negative for urgency, hematuria, flank pain, discharge, difficulty  urinating and genital sores.  Musculoskeletal: Positive for back pain, arthralgias and gait problem. Negative for myalgias and joint swelling.  Skin: Negative for rash.  Neurological: Negative for dizziness, syncope, speech difficulty, weakness, numbness and headaches.  Hematological: Negative for adenopathy. Does not bruise/bleed easily.  Psychiatric/Behavioral: Positive for sleep disturbance. Negative for behavioral problems and dysphoric mood. The patient is not nervous/anxious.        Objective:   Physical Exam  Constitutional: He is oriented to person, place, and time. He appears well-developed.  Slightly somnolent but appears  alert when stimulated and appropriate  HENT:  Head: Normocephalic.  Right Ear: External ear normal.  Left Ear: External ear normal.  Eyes: Conjunctivae and EOM are normal.  Neck: Normal range of motion.  Cardiovascular: Normal rate and normal heart sounds.   Pedal pulses full  Pulmonary/Chest: Breath sounds normal.  Abdominal: Bowel sounds are normal.  Musculoskeletal: Normal range of motion. He exhibits no edema and no tenderness.  No peripheral edema  Neurological: He is alert and oriented to person, place, and time.  Psychiatric: He has a normal mood and affect. His behavior is normal.          Assessment & Plan:   Parkinson's disease with orthostatic hypotension. Currently stable we'll continue present therapy History congestive heart failure stable

## 2013-06-26 NOTE — Patient Instructions (Addendum)
Limit your sodium (Salt) intake  Return in 3 months for follow-up    It is important that you exercise regularly, at least 20 minutes 3 to 4 times per week.

## 2013-07-12 ENCOUNTER — Other Ambulatory Visit: Payer: Self-pay | Admitting: Internal Medicine

## 2013-07-19 ENCOUNTER — Other Ambulatory Visit: Payer: Self-pay | Admitting: Internal Medicine

## 2013-07-30 ENCOUNTER — Other Ambulatory Visit: Payer: Self-pay | Admitting: Internal Medicine

## 2013-08-03 ENCOUNTER — Other Ambulatory Visit: Payer: Self-pay | Admitting: Internal Medicine

## 2013-08-18 ENCOUNTER — Other Ambulatory Visit: Payer: Self-pay | Admitting: Internal Medicine

## 2013-08-22 ENCOUNTER — Other Ambulatory Visit: Payer: Self-pay | Admitting: *Deleted

## 2013-09-23 ENCOUNTER — Other Ambulatory Visit: Payer: Self-pay | Admitting: Internal Medicine

## 2013-09-26 ENCOUNTER — Telehealth: Payer: Self-pay | Admitting: Internal Medicine

## 2013-09-26 NOTE — Telephone Encounter (Signed)
Patient Information:  Caller Name: [facility]  Phone: [phone]  Patient: [name], [name]  Gender: Male  DOB: [birth year]  Age: 79 Years  PCP: Eleonore Chiquito (Family Practice > 83yrs old)  Office Follow Up:  Does the office need to follow up with this patient?: Yes  Instructions For The Office: Son is requesting that he have appt or something called in .  Son is concerned about his father and age 35 years.  Triages to be seen today or tomorrow.  Aware of office hours.  RN Note:  Son is requesting that he have appt or something called in .  Son is concerned about his father and age 62 years.  Triages to be seen today or tomorrow.  Aware of office hours.  Symptoms  Reason For Call & Symptoms: Son states onset of symptoms five days ago.  Family has had cold/chest congestion.  His low grade fever 100.0 (o) yesterday 09/25/13 but no fever today.  he has chest /head congestion,  Intermittent wheezing noted. +cough productive yellow, no n/v/d.. More tired than usual.  Reviewed Health History In EMR: Yes  Reviewed Medications In EMR: Yes  Reviewed Allergies In EMR: Yes  Reviewed Surgeries / Procedures: Yes  Date of Onset of Symptoms: 09/22/2013  Treatments Tried: OTC Robitussion DM, increase fluids/foods  Treatments Tried Worked: No  Guideline(s) Used:  Colds  Cough  Disposition Per Guideline:   See Today or Tomorrow in Office  Reason For Disposition Reached:   Patient wants to be seen  Advice Given:  Reassurance  Coughing is the way that our lungs remove irritants and mucus. It helps protect our lungs from getting pneumonia.  Here is some care advice that should help.  Cough Medicines:  OTC Cough Drops: Cough drops can help a lot, especially for mild coughs. They reduce coughing by soothing your irritated throat and removing that tickle sensation in the back of the throat. Cough drops also have the advantage of portability - you can carry them with you.  Home Remedy - Hard Candy:  Hard candy works just as well as medicine-flavored OTC cough drops. Diabetics should use sugar-free candy.  Home Remedy - Honey: This old home remedy has been shown to help decrease coughing at night. The adult dosage is 2 teaspoons (10 ml) at bedtime. Honey should not be given to infants under one year of age.  Prevent Dehydration:  Drink adequate liquids.  This will help soothe an irritated or dry throat and loosen up the phlegm.  Avoid Tobacco Smoke:  Smoking or being exposed to smoke makes coughs much worse.  Coughing Spasms:  Drink warm fluids. Inhale warm mist (Reason: both relax the airway and loosen up the phlegm).  Suck on cough drops or hard candy to coat the irritated throat.  Call Back If:  Difficulty breathing  Fever lasts > 3 days  You become worse.  RN Overrode Recommendation:  Patient Requests Prescription  Son is requesting that he have appt or something called in .  Son is concerned about his father and age 89 years.  Triages to be seen today or tomorrow.  Aware of office hours.

## 2013-09-28 NOTE — Telephone Encounter (Signed)
Please call and check on status and schedule an appointment if needed

## 2013-09-28 NOTE — Telephone Encounter (Signed)
Pt son stated that pt is doing better and pt does have appt on 10/02/12 to be seen, they will keep that appt.

## 2013-10-02 ENCOUNTER — Ambulatory Visit (INDEPENDENT_AMBULATORY_CARE_PROVIDER_SITE_OTHER): Payer: Federal, State, Local not specified - PPO | Admitting: Internal Medicine

## 2013-10-02 ENCOUNTER — Encounter: Payer: Self-pay | Admitting: Internal Medicine

## 2013-10-02 VITALS — BP 114/70 | HR 86 | Temp 97.5°F | Resp 18 | Wt 150.0 lb

## 2013-10-02 DIAGNOSIS — G20 Parkinson's disease: Secondary | ICD-10-CM

## 2013-10-02 DIAGNOSIS — F03918 Unspecified dementia, unspecified severity, with other behavioral disturbance: Secondary | ICD-10-CM

## 2013-10-02 DIAGNOSIS — I509 Heart failure, unspecified: Secondary | ICD-10-CM

## 2013-10-02 DIAGNOSIS — I951 Orthostatic hypotension: Secondary | ICD-10-CM

## 2013-10-02 DIAGNOSIS — F0391 Unspecified dementia with behavioral disturbance: Secondary | ICD-10-CM

## 2013-10-02 MED ORDER — RIVASTIGMINE 9.5 MG/24HR TD PT24: MEDICATED_PATCH | TRANSDERMAL | Status: DC

## 2013-10-02 MED ORDER — CLOPIDOGREL BISULFATE 75 MG PO TABS: ORAL_TABLET | ORAL | Status: DC

## 2013-10-02 MED ORDER — BUDESONIDE 3 MG PO CP24: ORAL_CAPSULE | ORAL | Status: DC

## 2013-10-02 MED ORDER — MEMANTINE HCL 10 MG PO TABS: ORAL_TABLET | ORAL | Status: DC

## 2013-10-02 NOTE — Progress Notes (Signed)
Subjective:    Patient ID: [name], male    DOB: 1921-12-01, 80 y.o.   MRN: [id number]  HPI  80 year old patient who is seen today for followup. He has had a recent URI which has improved. The entire family had some acute illness. He has a history of Parkinson's disease with orthostatic hypotension. He has done remarkably well on Midodrine and there have been no falls. He still has hypersomnolence and sleeps from 10 PM to the early afternoon. He also takes occasional afternoon that. He has heart failure which has been stable. He remains on therapy for a dementia. He has cerebrovascular disease and is maintained on Plavix for prophylaxis. Multiple questions and issues addressed  Past Medical History  Diagnosis Date  . DEPRESSION 04/03/2007  . Diarrhea 06/20/2008  . DIVERTICULITIS, HX OF 04/03/2007  . GERD 08/31/2007  . HYPERTENSION 04/03/2007    now with hypotension  . MILD COGNITIVE IMPAIRMENT SO STATED 08/01/2008  . NEPHROLITHIASIS, HX OF 04/03/2007  . Neurasthenia 03/10/2009  . OSTEOPOROSIS 08/31/2007  . PROSTATE CANCER, HX OF 04/02/1997  . RADIATION PROCTITIS 01/14/2010  . SKIN CANCER, HX OF 04/03/2007    Had some removed   . TRANSIENT ISCHEMIC ATTACK 08/14/2009  . Lymphocytic colitis   . History of colon cancer 1960  . CHF (congestive heart failure)     History   Social History  . Marital Status: Married    Spouse Name: N/A    Number of Children: N/A  . Years of Education: N/A   Occupational History  . Retired     Counsellor   Social History Main Topics  . Smoking status: Former Smoker    Quit date: 04/07/1979  . Smokeless tobacco: Never Used  . Alcohol Use: Yes     Comment: 1 glass wine every 2-3 weeks  . Drug Use: No  . Sexual Activity: Not on file   Other Topics Concern  . Not on file   Social History Narrative  . No narrative on file    Past Surgical History  Procedure Laterality Date  . Hemicolectomy  1960's  . Lumbar laminectomy  2006  . Prostate  biopsy  1999  . Empyema drainage  1934  . Inguinal hernia repair  4098,1191  . Eye surgery  2009    Both eyes  . Rib resection    . Cataract extraction, bilateral  11/2008    Family History  Problem Relation Age of Onset  . Heart attack Father 53  . Liver cancer Mother 83  . Colon cancer Neg Hx     No Known Allergies  Current Outpatient Prescriptions on File Prior to Visit  Medication Sig Dispense Refill  . AMBULATORY NON FORMULARY MEDICATION Pressure Vision/ Lutein- One tablet by mouth twice daily      . diphenoxylate-atropine (LOMOTIL) 2.5-0.025 MG per tablet Take 1 tablet by mouth daily.       . midodrine (PROAMATINE) 5 MG tablet Take 5 mg by mouth 2 (two) times daily before lunch and supper.      . midodrine (PROAMATINE) 5 MG tablet TAKE 1 TABLET BY MOUTH THREE TIMES DAILY  90 tablet  2  . Omega-3 Fatty Acids (FISH OIL) 1000 MG CAPS Take 2 capsules by mouth daily.        . vitamin B-12 (CYANOCOBALAMIN) 500 MCG tablet Take 500 mcg by mouth daily.       No current facility-administered medications on file prior to visit.  BP 114/70  Pulse 86  Temp(Src) 97.5 F (36.4 C) (Oral)  Resp 18  Wt 150 lb (68.04 kg)  SpO2 93%       Review of Systems  Constitutional: Positive for activity change and fatigue. Negative for fever, chills and appetite change.  HENT: Negative for congestion, dental problem, ear pain, hearing loss, sore throat, tinnitus, trouble swallowing and voice change.   Eyes: Negative for pain, discharge and visual disturbance.  Respiratory: Positive for cough. Negative for chest tightness, wheezing and stridor.   Cardiovascular: Negative for chest pain, palpitations and leg swelling.  Gastrointestinal: Negative for nausea, vomiting, abdominal pain, diarrhea, constipation, blood in stool and abdominal distention.  Genitourinary: Negative for urgency, hematuria, flank pain, discharge, difficulty urinating and genital sores.  Musculoskeletal: Positive for  back pain and gait problem. Negative for arthralgias, joint swelling, myalgias and neck stiffness.  Skin: Negative for rash.  Neurological: Negative for dizziness, syncope, speech difficulty, weakness, numbness and headaches.  Hematological: Negative for adenopathy. Does not bruise/bleed easily.  Psychiatric/Behavioral: Positive for confusion and sleep disturbance. Negative for behavioral problems and dysphoric mood. The patient is not nervous/anxious.        Objective:   Physical Exam  Constitutional: He is oriented to person, place, and time. He appears well-developed.  Elderly. Clinically looks good. More alert than usual Blood pressure 114/70 O2 saturation 94%   HENT:  Head: Normocephalic.  Right Ear: External ear normal.  Left Ear: External ear normal.  Eyes: Conjunctivae and EOM are normal.  Neck: Normal range of motion.  Cardiovascular: Normal rate and normal heart sounds.   Pulmonary/Chest: He has rales.  Abdominal: Bowel sounds are normal.  Musculoskeletal: Normal range of motion. He exhibits no edema and no tenderness.  Neurological: He is alert and oriented to person, place, and time.  Psychiatric: He has a normal mood and affect. His behavior is normal.          Assessment & Plan:   PD with orthostatic hypotension stable Congestive heart failure. Compensated Dementia. Continue aggressive therapy  Low-salt diet will be continued More  activity recommended Return in 4 months for followup or as needed

## 2013-10-02 NOTE — Progress Notes (Signed)
Pre-visit discussion using our clinic review tool. No additional management support is needed unless otherwise documented below in the visit note.  

## 2013-10-02 NOTE — Patient Instructions (Signed)
Limit your sodium (Salt) intake  Return in 4 months for follow-up  

## 2013-11-29 ENCOUNTER — Other Ambulatory Visit: Payer: Self-pay | Admitting: Internal Medicine

## 2014-01-08 ENCOUNTER — Telehealth: Payer: Self-pay | Admitting: Internal Medicine

## 2014-01-08 NOTE — Telephone Encounter (Signed)
Pt wife called to get an appt with Dr Kirtland BouchardK may I use the SDA slot for Friday @ 4pm pt said husband is very unbalance and is having back pain did not want an earlier appt

## 2014-01-08 NOTE — Telephone Encounter (Signed)
Yes, please schedule.

## 2014-01-11 ENCOUNTER — Ambulatory Visit (INDEPENDENT_AMBULATORY_CARE_PROVIDER_SITE_OTHER): Payer: Federal, State, Local not specified - PPO | Admitting: Internal Medicine

## 2014-01-11 ENCOUNTER — Encounter: Payer: Self-pay | Admitting: Internal Medicine

## 2014-01-11 VITALS — BP 90/62 | HR 74 | Resp 18 | Ht 68.0 in | Wt 147.0 lb

## 2014-01-11 DIAGNOSIS — G219 Secondary parkinsonism, unspecified: Secondary | ICD-10-CM

## 2014-01-11 DIAGNOSIS — I951 Orthostatic hypotension: Secondary | ICD-10-CM

## 2014-01-11 DIAGNOSIS — F488 Other specified nonpsychotic mental disorders: Secondary | ICD-10-CM

## 2014-01-11 DIAGNOSIS — I509 Heart failure, unspecified: Secondary | ICD-10-CM

## 2014-01-11 LAB — CBC WITH DIFFERENTIAL/PLATELET
BASOS ABS: 0.1 10*3/uL (ref 0.0–0.1)
BASOS PCT: 1 % (ref 0–1)
EOS ABS: 0.2 10*3/uL (ref 0.0–0.7)
EOS PCT: 3 % (ref 0–5)
HCT: 38.3 % — ABNORMAL LOW (ref 39.0–52.0)
Hemoglobin: 12.8 g/dL — ABNORMAL LOW (ref 13.0–17.0)
Lymphocytes Relative: 17 % (ref 12–46)
Lymphs Abs: 1.2 10*3/uL (ref 0.7–4.0)
MCH: 32.8 pg (ref 26.0–34.0)
MCHC: 33.4 g/dL (ref 30.0–36.0)
MCV: 98.2 fL (ref 78.0–100.0)
Monocytes Absolute: 0.6 10*3/uL (ref 0.1–1.0)
Monocytes Relative: 9 % (ref 3–12)
NEUTROS PCT: 70 % (ref 43–77)
Neutro Abs: 4.9 10*3/uL (ref 1.7–7.7)
PLATELETS: 268 10*3/uL (ref 150–400)
RBC: 3.9 MIL/uL — ABNORMAL LOW (ref 4.22–5.81)
RDW: 13.6 % (ref 11.5–15.5)
WBC: 7 10*3/uL (ref 4.0–10.5)

## 2014-01-11 LAB — COMPREHENSIVE METABOLIC PANEL
ALK PHOS: 84 U/L (ref 39–117)
ALT: 27 U/L (ref 0–53)
AST: 21 U/L (ref 0–37)
Albumin: 3.9 g/dL (ref 3.5–5.2)
BILIRUBIN TOTAL: 0.8 mg/dL (ref 0.2–1.2)
BUN: 27 mg/dL — ABNORMAL HIGH (ref 6–23)
CO2: 28 mEq/L (ref 19–32)
Calcium: 9.4 mg/dL (ref 8.4–10.5)
Chloride: 106 mEq/L (ref 96–112)
Creat: 1.33 mg/dL (ref 0.50–1.35)
Glucose, Bld: 123 mg/dL — ABNORMAL HIGH (ref 70–99)
Potassium: 4.5 mEq/L (ref 3.5–5.3)
SODIUM: 143 meq/L (ref 135–145)
TOTAL PROTEIN: 6.3 g/dL (ref 6.0–8.3)

## 2014-01-11 NOTE — Patient Instructions (Signed)
Limit your sodium (Salt) intake  Return in 6 months for follow-up  

## 2014-01-11 NOTE — Progress Notes (Signed)
   Subjective:    Patient ID: [name], male    DOB: 04/02/1922, 80 y.o.   MRN: [id number]  HPI  Wt Readings from Last 3 Encounters:  01/11/14 147 lb (66.679 kg)  10/02/13 150 lb (68.04 kg)  06/26/13 148 lb 3.2 oz (67.223 kg)    Review of Systems     Objective:   Physical Exam        Assessment & Plan:

## 2014-01-11 NOTE — Progress Notes (Signed)
Subjective:    Patient ID: [name], male    DOB: 11/02/1921, 80 y.o.   MRN: [id number]  HPI 80 year old patient who is seen today for followup.  He has a history of parkinsonism, mild dementia, and orthostatic hypotension.  He is doing reasonably well, but does require a 24/7 assistance.  He is accompanied by 2 sons today.  His weight has been stable.  No swallowing difficulties or coughing.  Afebrile No recent falls or syncope  Past Medical History  Diagnosis Date  . DEPRESSION 04/03/2007  . Diarrhea 06/20/2008  . DIVERTICULITIS, HX OF 04/03/2007  . GERD 08/31/2007  . HYPERTENSION 04/03/2007    now with hypotension  . MILD COGNITIVE IMPAIRMENT SO STATED 08/01/2008  . NEPHROLITHIASIS, HX OF 04/03/2007  . Neurasthenia 03/10/2009  . OSTEOPOROSIS 08/31/2007  . PROSTATE CANCER, HX OF 04/02/1997  . RADIATION PROCTITIS 01/14/2010  . SKIN CANCER, HX OF 04/03/2007    Had some removed   . TRANSIENT ISCHEMIC ATTACK 08/14/2009  . Lymphocytic colitis   . History of colon cancer 1960  . CHF (congestive heart failure)     History   Social History  . Marital Status: Married    Spouse Name: N/A    Number of Children: N/A  . Years of Education: N/A   Occupational History  . Retired     [employer]   Social History Main Topics  . Smoking status: Former Smoker    Quit date: 04/07/1979  . Smokeless tobacco: Never Used  . Alcohol Use: Yes     Comment: 1 glass wine every 2-3 weeks  . Drug Use: No  . Sexual Activity: Not on file   Other Topics Concern  . Not on file   Social History Narrative  . No narrative on file    Past Surgical History  Procedure Laterality Date  . Hemicolectomy  1960's  . Lumbar laminectomy  2006  . Prostate biopsy  1999  . Empyema drainage  1934  . Inguinal hernia repair  [id number]  . Eye surgery  2009    Both eyes  . Rib resection    . Cataract extraction, bilateral  11/2008    Family History  Problem Relation Age of Onset  . Heart attack Father  11078  . Liver cancer Mother 7072  . Colon cancer Neg Hx     No Known Allergies  Current Outpatient Prescriptions on File Prior to Visit  Medication Sig Dispense Refill  . AMBULATORY NON FORMULARY MEDICATION Pressure Vision/ Lutein- One tablet by mouth twice daily      . budesonide (ENTOCORT EC) 3 MG 24 hr capsule TAKE 3 CAPSULES BY MOUTH EVERY MORNING  90 capsule  3  . clopidogrel (PLAVIX) 75 MG tablet TAKE 1 TABLET BY MOUTH EVERY DAY  90 tablet  1  . diphenoxylate-atropine (LOMOTIL) 2.5-0.025 MG per tablet Take 1 tablet by mouth daily.       . memantine (NAMENDA) 10 MG tablet TAKE 1 TABLET BY MOUTH TWICE DAILY  180 tablet  1  . midodrine (PROAMATINE) 5 MG tablet Take 5 mg by mouth 2 (two) times daily before lunch and supper.      . midodrine (PROAMATINE) 5 MG tablet TAKE 1 TABLET BY MOUTH THREE TIMES DAILY  90 tablet  2  . Omega-3 Fatty Acids (FISH OIL) 1000 MG CAPS Take 2 capsules by mouth daily.        . rivastigmine (EXELON) 9.5 mg/24hr PLACE 1 PATCH ONTO THE  SKIN DAILY AS DIRECTED  30 patch  5  . vitamin B-12 (CYANOCOBALAMIN) 500 MCG tablet Take 500 mcg by mouth daily.       No current facility-administered medications on file prior to visit.    BP 90/62  Pulse 74  Resp 18  Ht 5\' 8"  (1.727 m)  Wt 147 lb (66.679 kg)  BMI 22.36 kg/m2  SpO2 94%      Review of Systems  Constitutional: Positive for fatigue. Negative for fever, chills and appetite change.  HENT: Negative for congestion, dental problem, ear pain, hearing loss, sore throat, tinnitus, trouble swallowing and voice change.   Eyes: Negative for pain, discharge and visual disturbance.  Respiratory: Negative for cough, chest tightness, wheezing and stridor.   Cardiovascular: Negative for chest pain, palpitations and leg swelling.  Gastrointestinal: Negative for nausea, vomiting, abdominal pain, diarrhea, constipation, blood in stool and abdominal distention.  Genitourinary: Negative for urgency, hematuria, flank pain,  discharge, difficulty urinating and genital sores.  Musculoskeletal: Negative for arthralgias, back pain, gait problem, joint swelling, myalgias and neck stiffness.  Skin: Positive for rash.  Neurological: Positive for weakness. Negative for dizziness, syncope, speech difficulty, numbness and headaches.  Hematological: Negative for adenopathy. Does not bruise/bleed easily.  Psychiatric/Behavioral: Positive for sleep disturbance. Negative for behavioral problems and dysphoric mood. The patient is not nervous/anxious.        Objective:   Physical Exam  Constitutional: He is oriented to person, place, and time. He appears well-developed.  weak Wheelchair-bound  HENT:  Head: Normocephalic.  Right Ear: External ear normal.  Left Ear: External ear normal.  Bilateral soft cerumen impactions  Eyes: Conjunctivae and EOM are normal.  Neck: Normal range of motion.  Cardiovascular: Normal rate and normal heart sounds.   Pulmonary/Chest: Breath sounds normal.  Abdominal: Bowel sounds are normal.  Musculoskeletal: Normal range of motion. He exhibits no edema and no tenderness.  Neurological: He is alert and oriented to person, place, and time.  Psychiatric: He has a normal mood and affect. His behavior is normal.          Assessment & Plan:   Parkinson's disease Bilateral cerumen impactions.  Canals irrigated until clear Mild dementia Orthostatic hypotension, stable on present regimen  Laboratory update No change in medications Recheck 6 months

## 2014-01-11 NOTE — Progress Notes (Signed)
Pre-visit discussion using our clinic review tool. No additional management support is needed unless otherwise documented below in the visit note.  

## 2014-01-12 LAB — TSH: TSH: 1.626 u[IU]/mL (ref 0.350–4.500)

## 2014-01-31 ENCOUNTER — Ambulatory Visit: Payer: Federal, State, Local not specified - PPO | Admitting: Internal Medicine

## 2014-02-19 ENCOUNTER — Other Ambulatory Visit: Payer: Self-pay | Admitting: Internal Medicine

## 2014-03-01 ENCOUNTER — Telehealth: Payer: Self-pay | Admitting: Internal Medicine

## 2014-03-01 NOTE — Telephone Encounter (Signed)
Spoke to Stanton, told her pt needs to hold Plavix 5 days prior to dental extraction per Dr. Kriste Basque verbalized understanding.

## 2014-03-01 NOTE — Telephone Encounter (Signed)
Caller: Kathleen/child; CB#[phone]; Reason for call: States pt scheduled to have have tooth extraction (one molar) next week on Tues 03/05/14 and wants to make sure that is ok since he takes a blood thinner. Assured caller will send message for MD review and someone will call back today with MD instructions/recommendations. Agreed to plan.

## 2014-03-01 NOTE — Telephone Encounter (Signed)
Hold Plavix 5 days prior to dental extraction

## 2014-03-01 NOTE — Telephone Encounter (Signed)
Please advise 

## 2014-03-21 ENCOUNTER — Other Ambulatory Visit: Payer: Self-pay | Admitting: Internal Medicine

## 2014-04-04 ENCOUNTER — Telehealth: Payer: Self-pay | Admitting: Internal Medicine

## 2014-04-04 NOTE — Telephone Encounter (Signed)
Patient Information:  Caller Name: [name]  Phone: [phone]  Patient: [name], [name]  Gender: Male  DOB: 11/21/1921  Age: 80 Years  PCP: [name], [name] (Family Practice > 5439yrs old)  Office Follow Up:  Does the office need to follow up with this patient?: Yes  Instructions For The Office: ED VS Office visit .  Please review and advise. Family states they did not call EMS. Patient is sitting in chair alert.  PLEASE REVIEW AND ADVISE.   PLEASE CONTACT SON.  RN Note:  ED VS Office visit .  Please review and advise. Family states they did not call EMS. Patient is sitting in chair alert.  PLEASE REVIEW AND ADVISE.  Symptoms  Reason For Call & Symptoms: Son states that his father has passed twice in the last hour and half. lasting up to 1-2 minutes. This is occurred once yesterday.  Family is with patient and is supporting him.  Marland Kitchen. He can stand and walk. He is currently awake and talking . Son states a history of passing out but never this frequency.   This occurrs with exertion.  Denies CP . Shallow breathing. No blueness around face. Marland Kitchen.  He is not dizzy or lightheaded.  Reviewed Health History In EMR: Yes  Reviewed Medications In EMR: Yes  Reviewed Allergies In EMR: Yes  Reviewed Surgeries / Procedures: Yes  Date of Onset of Symptoms: 04/04/2014  Guideline(s) Used:  Weakness (Generalized) and Fatigue  Disposition Per Guideline:   Go to Office Now  Reason For Disposition Reached:   Pale skin (pallor)  Advice Given:  Call Back If:  Unable to stand or walk  Passes out  Breathing difficulty occurs  You become worse.  RN Overrode Recommendation:  Go To ED  ED VS Office visit .  Please review and advise. Family states they did not call EMS. Patient is sitting in chair alert.  PLEASE REVIEW AND ADVISE.

## 2014-04-04 NOTE — Telephone Encounter (Signed)
Please schedule neurology followup with Dr. Arbutus LeasAT

## 2014-04-05 ENCOUNTER — Telehealth: Payer: Self-pay | Admitting: Neurology

## 2014-04-05 NOTE — Telephone Encounter (Signed)
Left message on voicemail to call office.  

## 2014-04-05 NOTE — Telephone Encounter (Signed)
Spoke with patient's son. He states his dad had episodes of "passing out" for over a year now, but this has been very infrequent. The past few days it has been happening daily (sometimes a couple times a day). I advised that we would not want him to wait to see us on Monday if this is a daily occurrence and my not be neurologically related. I encouraged them to be seen in the ER for evaluation. He will consider this and keep appt for Monday for now.

## 2014-04-05 NOTE — Telephone Encounter (Signed)
[name], pt's son called back told him needs to make follow up with Neurology, Dr. Lurena Joinerebecca Tat. [name] verbalized understanding.

## 2014-04-08 ENCOUNTER — Ambulatory Visit (INDEPENDENT_AMBULATORY_CARE_PROVIDER_SITE_OTHER): Payer: Federal, State, Local not specified - PPO | Admitting: Neurology

## 2014-04-08 ENCOUNTER — Encounter: Payer: Self-pay | Admitting: Neurology

## 2014-04-08 VITALS — BP 96/60 | HR 65

## 2014-04-08 DIAGNOSIS — R488 Other symbolic dysfunctions: Secondary | ICD-10-CM

## 2014-04-08 DIAGNOSIS — G4752 REM sleep behavior disorder: Secondary | ICD-10-CM

## 2014-04-08 DIAGNOSIS — G20 Parkinson's disease: Secondary | ICD-10-CM

## 2014-04-08 DIAGNOSIS — R482 Apraxia: Secondary | ICD-10-CM

## 2014-04-08 DIAGNOSIS — I951 Orthostatic hypotension: Secondary | ICD-10-CM

## 2014-04-08 DIAGNOSIS — F0391 Unspecified dementia with behavioral disturbance: Secondary | ICD-10-CM

## 2014-04-08 DIAGNOSIS — F03918 Unspecified dementia, unspecified severity, with other behavioral disturbance: Secondary | ICD-10-CM

## 2014-04-08 MED ORDER — MIDODRINE HCL 10 MG PO TABS: 10.0000 mg | ORAL_TABLET | Freq: Two times a day (BID) | ORAL | Status: DC

## 2014-04-08 NOTE — Progress Notes (Signed)
[name] was seen today in the movement disorders clinic for neurologic consultation at the request of Rogelia Boga, MD.  The consultation is for the evaluation of parkinsonism and dementia.  He is accompanied by wife and son who supplement the hx.     The pt was dx with Parkinsonism 3 years ago when he had a TIA.  He went to the hospital b/c he was having trouble moving and had collapsed in the bathroom.  He was conscious but his had facial droop (unknown side).  Sx's lasted a few hours.  He did not have tremor at that point in time but he had shuffling gait prior to that.  He was evaluated in the hospital and told by the neurologist that he had parkinsonism.  There was no outside f/u with a neurologist.  I reviewed in November, 2010 records that were made available to me.  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  I do not see any mention of parkinsonism.  Several weeks ago, the patient had an episode of closing eyes, fully conscious but not responding appropriately.  No etiology was found.    03/12/13 Update  The patient presents today with his family for a followup.  Because of significant orthostatic hypotension, his primary care physician cautiously tried Florinef.  The patient did develop peripheral edema (mild per son/wife) and unrelated hematuria that turned out to be due to urinary tract infection.  Nonetheless, the Florinef was discontinued and he was ultimately started on Midodrine.  He is on no medication for parkinsonism because of the significant orthostatic hypotension. Fortunately, since being placed on the midodrine, he has been markedly better.  His son states that 90% of the time he is able to walk and is no longer in the wheelchair.  He does have someone walking next to him most of the time.  He has done much better with the hospital bed in terms of getting out of bed because of the rails.  He does not like it, however and  does try to get out.  His wife and son have installed cameras in the room and can't see when he tries to get up.  A bed alarm did not work for them, as it woke the whole family up.  He has not fallen since last visit.  He has had no hallucinations.  He has had no passing out episodes and no episodes of near syncope.  He has had no hallucinations.  04/08/14 update:  The patient is following up today, after not having been seen for over one year.  He is accompanied by wife and 2 sons, who supply the hx. I did review the records in the chart since last visit.  The patient has a history of severe orthostatic hypotension associated with parkinsonism.  He has been on midodrine but hasn't been taking it tid because of sleeping and so has been taking it bid at most.  They were told not to give it to him within 4 hours of sleep and he sleeps all of the time, so sometimes he does not get it even twice a day.  Overall, he has done well over the last year until last Thursday, when he passed out multiple times in the same day.  When he called his primary care physician for advice, he was told to make a followup appointment at our office.  The patient is not even on levodopa because of the risk  for orthostatic hypotension.  We were going to reevaluate that when he followed up in December, but he did not come for his six-month followup.   Today, the patients family states that there are generally are fainting episodes every 4-6 weeks, when he moves from prone to sitting and he will be "out" for up to 2 minutes.  No loss of bladder/bowel control.  The frequency has been increasing over the last 3 weeks, but last week he had 2 consecutive days, with 3 episodes.  He then did okay for a few day, and then had a few seconds of LOC yesterday.  They are also c/o EDS, sleeping 16-17 hours per night.  He doesn't awaken until 2 pm.  If they wake him up earlier in the day, he will not make sense when he speaks.  The patient remains on the  Exelon patch for dementia.  Neuroimaging has  previously been performed.  It is available for my review today.  I reviewed his MRI of the brain without gadolinium from 2010.  There was marked atrophy, both central and peripheral.  There was a moderate amount of small vessel disease scattered throughout the subcortical white matter.  This was not significant in the basal ganglia.  A CT from April, 2014 showed continued atrophy.  *RADIOLOGY REPORT*   Clinical Data: 80 year old male with altered mental status.   CT HEAD WITHOUT CONTRAST   Technique:  Contiguous axial images were obtained from the base of the skull through the vertex without contrast.   Comparison: 09/10/2011 CT   Findings: Mild generalized cerebral volume loss and mild chronic small vessel white matter ischemic changes again noted.   No acute intracranial abnormalities are identified, including mass lesion or mass effect, hydrocephalus, extra-axial fluid collection, midline shift, hemorrhage, or acute infarction.   The visualized bony calvarium is unremarkable.   IMPRESSION: No evidence of acute intracranial abnormality. Clinical Data:  Weakness.  Slurred speech.  Left facial droop.    MRI HEAD WITHOUT CONTRAST MRA HEAD WITHOUT CONTRAST    Technique: Multiplanar, multiecho pulse sequences of the brain and surrounding structures were obtained according to standard protocol without intravenous contrast.  Angiographic images of the head were obtained using MRA technique without contrast.    Comparison: No comparison MR.  Prior head CT 08/14/2009.    MRI HEAD    Findings:  Small acute non hemorrhagic infarcts scattered throughout the right hemisphere involving portions of the right frontal lobe, right parietal lobe, right occipital lobe and right temporal lobe.    No intracranial hemorrhage.  Moderate small vessel disease type changes.  Global atrophy without hydrocephalus.  No intracranial mass lesion  detected on this unenhanced exam.  Minimal paranasal sinus mucosal thickening.    Mild transverse ligament is feet.  Minimal bulge C3-4.    IMPRESSION: Scattered small acute non hemorrhagic infarcts throughout the right hemisphere as noted above.    MRA HEAD    Findings: Anterior circulation without medium or large size vessel significant stenosis or occlusion.    Left vertebral artery is dominant in size.  No significant narrowing of the basilar artery.  Mild branch vessel irregularity. No aneurysm noted.    IMPRESSION: Mild intracranial atherosclerotic type changes as discussed above.   PREVIOUS MEDICATIONS: none to date  ALLERGIES:  No Known Allergies  CURRENT MEDICATIONS:  Current Outpatient Prescriptions on File Prior to Visit  Medication Sig Dispense Refill  . AMBULATORY NON FORMULARY MEDICATION Pressure Vision/ Lutein- One tablet by mouth twice  daily      . budesonide (ENTOCORT EC) 3 MG 24 hr capsule TAKE 3 CAPSULES BY MOUTH EVERY MORNING.  90 capsule  0  . clopidogrel (PLAVIX) 75 MG tablet TAKE 1 TABLET BY MOUTH EVERY DAY  90 tablet  1  . memantine (NAMENDA) 10 MG tablet TAKE 1 TABLET BY MOUTH TWICE DAILY  180 tablet  1  . midodrine (PROAMATINE) 5 MG tablet TAKE 1 TABLET BY MOUTH THREE TIMES DAILY  90 tablet  2  . Omega-3 Fatty Acids (FISH OIL) 1000 MG CAPS Take 2 capsules by mouth daily.        . rivastigmine (EXELON) 9.5 mg/24hr PLACE 1 PATCH ONTO THE SKIN DAILY AS DIRECTED  30 patch  5  . vitamin B-12 (CYANOCOBALAMIN) 500 MCG tablet Take 500 mcg by mouth daily.       No current facility-administered medications on file prior to visit.    PAST MEDICAL HISTORY:   Past Medical History  Diagnosis Date  . DEPRESSION 04/03/2007  . Diarrhea 06/20/2008  . DIVERTICULITIS, HX OF 04/03/2007  . GERD 08/31/2007  . HYPERTENSION 04/03/2007    now with hypotension  . MILD COGNITIVE IMPAIRMENT SO STATED 08/01/2008  . NEPHROLITHIASIS, HX OF 04/03/2007  . Neurasthenia 03/10/2009  .  OSTEOPOROSIS 08/31/2007  . PROSTATE CANCER, HX OF 04/02/1997  . RADIATION PROCTITIS 01/14/2010  . SKIN CANCER, HX OF 04/03/2007    Had some removed   . TRANSIENT ISCHEMIC ATTACK 08/14/2009  . Lymphocytic colitis   . History of colon cancer 1960  . CHF (congestive heart failure)     PAST SURGICAL HISTORY:   Past Surgical History  Procedure Laterality Date  . Hemicolectomy  1960's  . Lumbar laminectomy  2006  . Prostate biopsy  1999  . Empyema drainage  1934  . Inguinal hernia repair  [id number]  . Eye surgery  2009    Both eyes  . Rib resection    . Cataract extraction, bilateral  11/2008    SOCIAL HISTORY:   History   Social History  . Marital Status: Married    Spouse Name: N/A    Number of Children: N/A  . Years of Education: N/A   Occupational History  . Retired     [employer]   Social History Main Topics  . Smoking status: Former Smoker    Quit date: 04/07/1979  . Smokeless tobacco: Never Used  . Alcohol Use: Yes     Comment: 1 glass wine every 2-3 weeks  . Drug Use: No  . Sexual Activity: Not on file   Other Topics Concern  . Not on file   Social History Narrative  . No narrative on file    FAMILY HISTORY:   Family Status  Relation Status Death Age  . Mother Deceased     [place]  . Father Deceased     [place]  . Brother Deceased     3, [place], [place]  . Sister Deceased     [place]  . Child Alive     6, healthy    ROS:  A complete 10 system review of systems was obtained and was unremarkable apart from what is mentioned above.  PHYSICAL EXAMINATION:    VITALS:   Filed Vitals:   04/08/14 1111  BP: 96/60  Pulse: 65    GEN:  The patient appears stated age and is in NAD. HEENT:  Normocephalic, atraumatic.  The mucous membranes are moist. The superficial temporal arteries are without  ropiness or tenderness. CV:  RRR Lungs:  CTAB Neck/HEME:  There are no carotid bruits bilaterally.  Neurological examination:  Orientation: A complete MMSE was performed  last year and the pt scored a 10/30.  Today, the patient relies mostly on his son and wife to provide accurate history.  He does not remember much of what has gone on. Cranial nerves: There is good facial symmetry.  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  Marland Kitchen  Extraocular muscles are intact.  He has difficulty participating with formal confrontational visual field testing, but the visual fields do appear to be intact with visual numbness. The speech is fluent and clear but hypophonic and lacks spontaneity. Soft palate rises symmetrically and there is no tongue deviation. Hearing is intact to conversational tone. Sensation: The patient has difficulty participating with a sensitive aspects of the sensory examination, but sensation does appear to be symmetric and no sensory dermatomal level was identified. Motor: Strength is 5/5 in the bilateral upper and at least antigravity in the lower extremities.   Shoulder shrug is equal and symmetric.  There is no pronator drift.  Movement examination: Tone: There is mildly increased tone in the bilateral upper extremities.  There is, however, a gegenhalten quality. The tone in the lower extremities is normal.  Abnormal movements: There is no tremor present today. Coordination:  There is mild decremation with RAM's with hand opening and closing. Gait and Station: Not tested today, due to the above complaints of passing out.  ASSESSMENT/PLAN:  1.  Parkinsonism.  -Because the patient and his family have difficulty with remembering the exact sequence of events in terms of symptom progression, it is very good difficult to diagnose this as moderate to moderately advanced Parkinson's or one of the other parkinsonian states, such as frontotemporal dementia.  Today, I am leaning towards frontotemporal dementia. I do not think he has Lewy body dementia, as hallucinations were not and are not a prominent feature and do not think that he has NPH, as he has both central and peripheral  atrophy.  I did review November, 2010 records available to me and did not see any diagnosis of Parkinson's or any parkinsonian states.  -Increase midodrine to 10 mg twice a day.  We did about the risks of midodrine, including supine hypertension.  They understand ramifications of this.  They have trouble getting this in 3 times a day because of how often he sleeps.  We discussed Florinef, but he does have a history of volume overload.  We discussed droxidopa but this cannot be used in conjunction with midodrine.  All were in agreement with the plan 2.  REM behavior disorder.  -This is common with alpha synucleinopathies.  However, I think that the treatments would be worse than the disease and we discussed this again today. 3.  Dementia.  -He is already on Exelon patch, 9.5 mg daily and is having no problems with this.  I agree with this.    -We decided to discontinue the Namenda. 4.  eyelid opening apraxia.  -This has actually improved somewhat.  If it becomes worse and interferes with his activities of daily living, including walking, then we can do Botox, which would be of great value. 5. EDS  -I think that this is just a progression of the dementia.  We had a long discussion about this today.  I see no medications that should be contributing, but did stop the [employer]

## 2014-04-08 NOTE — Patient Instructions (Signed)
1. Stop Namenda 2. Increase Midodrine to 10 mg tablets twice daily.  3. Call with any questions.

## 2014-04-19 ENCOUNTER — Other Ambulatory Visit: Payer: Self-pay | Admitting: Internal Medicine

## 2014-04-29 ENCOUNTER — Telehealth: Payer: Self-pay | Admitting: Neurology

## 2014-04-29 NOTE — Telephone Encounter (Signed)
Pt son [name] called and said that father behavior has changed with new medication please call  [phone]

## 2014-04-29 NOTE — Telephone Encounter (Signed)
Patient's son states for the past week patient has been less responsive. He is only using the wheelchair for transfers when he was able to ambulate with assistance. He often times babbles incoherently. He is eating very little. Patient was changed from Midodrine 5 mg TID to Midodrine 10 mg BID and his Namenda was stopped after last visit on 04/08/2014. Please advise.

## 2014-04-29 NOTE — Telephone Encounter (Signed)
Called and spoke with patient's son.  Patient has been sleeping for 12-16 hours per day, but is easily arousable. They are concerned he is not eating or drinking enough fluids, and I suggested that they may wake him up, if needed. Patient seems relatively comfortable without any signs of infection, per son. I also mentioned that part of his mental status changes may be due progression of his underlying dementia.  No medication changes were suggested at this time.  Donika K. Allena KatzPatel, DO

## 2014-05-03 ENCOUNTER — Telehealth: Payer: Self-pay | Admitting: Internal Medicine

## 2014-05-03 ENCOUNTER — Telehealth: Payer: Self-pay | Admitting: Neurology

## 2014-05-03 DIAGNOSIS — F0391 Unspecified dementia with behavioral disturbance: Secondary | ICD-10-CM

## 2014-05-03 DIAGNOSIS — I509 Heart failure, unspecified: Secondary | ICD-10-CM

## 2014-05-03 DIAGNOSIS — G20 Parkinson's disease: Secondary | ICD-10-CM

## 2014-05-03 DIAGNOSIS — F03918 Unspecified dementia, unspecified severity, with other behavioral disturbance: Secondary | ICD-10-CM

## 2014-05-03 NOTE — Telephone Encounter (Signed)
Pt's son called following up on the message he sent on Monday 04/29/14. C/B [phone]

## 2014-05-03 NOTE — Telephone Encounter (Signed)
Patient's son made aware of message below. He will call with any questions.

## 2014-05-03 NOTE — Telephone Encounter (Signed)
Pt son [name] would like dr Kirtland Bouchardk to return his call concerning his father health condition

## 2014-05-03 NOTE — Telephone Encounter (Signed)
I actually talked with Dr. Allena KatzPatel about this case.  She told me that she called them and spoke with them and I agree with her that this is likely a progression of dementia.  It is actually really common as dementia progresses to have more sleep.  As long as he is easily arousable and no fevers and still eating/drinking, then it doesn't sound infectious.  They certainly could f/u with PCP and that would be reasonable but don't think that this would be from stopping the Namenda, if that is what they are asking.  We could go back on it but feel confident it won't make a difference in these sx's.

## 2014-05-03 NOTE — Telephone Encounter (Signed)
Patient's son spoke with Dr Allena KatzPatel on Monday, but would like your opinion about changing the medication. Please advise.   [place] Chardonika K Patel, DO at 04/29/2014 4:28 PM    Status: Signed       Called and spoke with patient's son. Patient has been sleeping for 12-16 hours per day, but is easily arousable. They are concerned he is not eating or drinking enough fluids, and I suggested that they may wake him up, if needed. Patient seems relatively comfortable without any signs of infection, per son. I also mentioned that part of his mental status changes may be due progression of his underlying dementia. No medication changes were suggested at this time.  Donika K. Allena KatzPatel, DO        Silvio PateJade L Juni Glaab, CMA at 04/29/2014 1:14 PM     Status: Signed        Patient's son states for the past week patient has been less responsive. He is only using the wheelchair for transfers when he was able to ambulate with assistance. He often times babbles incoherently. He is eating very little. Patient was changed from Midodrine 5 mg TID to Midodrine 10 mg BID and his Namenda was stopped after last visit on 04/08/2014. Please advise.         Randa Lynnana T Smith at 04/29/2014 11:15 AM     Status: Signed        Pt son matt Bowerman called and said that father behavior has changed with new medication please call [phone]

## 2014-05-06 NOTE — Telephone Encounter (Signed)
Dr. Kirtland BouchardK, talked with son need to order Hospice per Dr. Kirtland BouchardK.   Order for referral to Hospice done.

## 2014-05-09 ENCOUNTER — Telehealth: Payer: Self-pay | Admitting: Internal Medicine

## 2014-05-09 NOTE — Telephone Encounter (Signed)
Hospice pallative is calling regarding pt is requesting a DNR, needing a verbal order.

## 2014-05-09 NOTE — Telephone Encounter (Signed)
Spoke to [place] with Hospice, gave verbal order for DNR for pt. [name] verbalized understanding.

## 2014-05-20 ENCOUNTER — Other Ambulatory Visit: Payer: Self-pay | Admitting: Internal Medicine

## 2014-05-30 ENCOUNTER — Telehealth: Payer: Self-pay | Admitting: Internal Medicine

## 2014-05-30 NOTE — Telephone Encounter (Signed)
Please advise 

## 2014-05-30 NOTE — Telephone Encounter (Signed)
Pt may have a tooth extracted on sept 9. Would like to know how long prior to extraction should pt stop plavix  or anything else that could interfere w/ that procedure.Marland Kitchen

## 2014-05-31 NOTE — Telephone Encounter (Signed)
Stop Plavix 5 days prior to dental extraction

## 2014-05-31 NOTE — Telephone Encounter (Signed)
Spoke to [place], told her pt must stop Plavix 5 days prior to dental extraction and no Ibuprofen or NSAIDS, only Tylenol. [name] verbalized understanding.

## 2014-06-04 ENCOUNTER — Other Ambulatory Visit: Payer: Self-pay | Admitting: Neurology

## 2014-06-04 NOTE — Telephone Encounter (Signed)
Midodrine refill requested. Per last office note- patient to remain on medication. Refill approved and sent to patient's pharmacy.   

## 2014-06-05 ENCOUNTER — Telehealth: Payer: Self-pay | Admitting: Internal Medicine

## 2014-06-05 NOTE — Telephone Encounter (Signed)
Okey Regal from [facility] called family reports pt is not sleeping well at night. Pt family is requesting a sleep aid     Okey Regal phone number is [phone]

## 2014-06-05 NOTE — Telephone Encounter (Signed)
Please see message and advise 

## 2014-06-05 NOTE — Telephone Encounter (Signed)
Avoid daytime naps Avoid sleeping pills at night

## 2014-06-05 NOTE — Telephone Encounter (Signed)
Called [name], told her Dr.K said to avoid daytime naps, keep pt active during the day, and avoid sleeping pills at night. [name]  Verbalized understanding and will let family know.

## 2014-06-06 ENCOUNTER — Telehealth: Payer: Self-pay | Admitting: Internal Medicine

## 2014-06-06 NOTE — Telephone Encounter (Signed)
Spoke to pt's son Susy Frizzle, told him okay to restart Plavix today. Matt verbalized understanding.

## 2014-06-06 NOTE — Telephone Encounter (Signed)
Daughter in law called to ask when pt should go back on his plavix. Pt was taken off of the plavix due to dental surgery and he had that done on 06/05/14

## 2014-06-07 ENCOUNTER — Telehealth: Payer: Self-pay | Admitting: Internal Medicine

## 2014-06-07 NOTE — Telephone Encounter (Signed)
Okey Regal states UA was sent to lab and the results are being faxed today to Dr. Kirtland Bouchard.

## 2014-06-10 ENCOUNTER — Telehealth: Payer: Self-pay | Admitting: Internal Medicine

## 2014-06-10 NOTE — Telephone Encounter (Signed)
Moderate fluids several hours before bedtime.  Avoid all caffeinated beverages

## 2014-06-10 NOTE — Telephone Encounter (Signed)
Please see message and advise 

## 2014-06-10 NOTE — Telephone Encounter (Signed)
Carol from hospice called to say urinalysis  was negative but pt continues to urinate 18 times doing the night. Okey Regal would like to know if there is any medication that may help    Okey Regal phone number; [phone]

## 2014-06-10 NOTE — Telephone Encounter (Signed)
See other message

## 2014-06-10 NOTE — Telephone Encounter (Signed)
Spoke to [place], told her Dr. Kirtland Bouchard said to moderate fluids several hours before bedtime. Avoid all caffeinated beverages. Okey Regal verbalized understanding.

## 2014-06-22 ENCOUNTER — Encounter: Payer: Self-pay | Admitting: Gastroenterology

## 2014-07-04 ENCOUNTER — Ambulatory Visit (INDEPENDENT_AMBULATORY_CARE_PROVIDER_SITE_OTHER): Payer: Federal, State, Local not specified - PPO | Admitting: Internal Medicine

## 2014-07-04 ENCOUNTER — Encounter: Payer: Self-pay | Admitting: Internal Medicine

## 2014-07-04 VITALS — BP 112/80

## 2014-07-04 DIAGNOSIS — F03918 Unspecified dementia, unspecified severity, with other behavioral disturbance: Secondary | ICD-10-CM

## 2014-07-04 DIAGNOSIS — F488 Other specified nonpsychotic mental disorders: Secondary | ICD-10-CM

## 2014-07-04 DIAGNOSIS — G219 Secondary parkinsonism, unspecified: Secondary | ICD-10-CM

## 2014-07-04 DIAGNOSIS — I951 Orthostatic hypotension: Secondary | ICD-10-CM

## 2014-07-04 DIAGNOSIS — I5020 Unspecified systolic (congestive) heart failure: Secondary | ICD-10-CM

## 2014-07-04 DIAGNOSIS — R634 Abnormal weight loss: Secondary | ICD-10-CM

## 2014-07-04 DIAGNOSIS — F0391 Unspecified dementia with behavioral disturbance: Secondary | ICD-10-CM

## 2014-07-04 NOTE — Progress Notes (Signed)
Pre visit review using our clinic review tool, if applicable. No additional management support is needed unless otherwise documented below in the visit note. 

## 2014-07-04 NOTE — Patient Instructions (Signed)
Limit your sodium (Salt) intake  Return in 6 months for follow-up  

## 2014-07-04 NOTE — Progress Notes (Signed)
Subjective:    Patient ID: [name], male    DOB: Dec 02, 1921, 80 y.o.   MRN: [id number]  HPI  Wt Readings from Last 3 Encounters:  01/11/14 147 lb (66.679 kg)  10/02/13 150 lb (68.04 kg)  06/26/13 148 lb 3.2 oz (67.37 kg)   80 year old patient who is seen today for his bimonthly followup.  He is followed by neurology due to dementia and Parkinson's disease.  He does have a history of orthostatic hypotension, as well as congestive heart failure.  He has some occasional pedal edema, but no overt signs or symptoms of heart failure. Hypersomnolence is slightly less of a concern, but still sleeping 10-14 hours daily. There has been a significant weight loss over the past 6 months.  Past Medical History  Diagnosis Date  . DEPRESSION 04/03/2007  . Diarrhea 06/20/2008  . DIVERTICULITIS, HX OF 04/03/2007  . GERD 08/31/2007  . HYPERTENSION 04/03/2007    now with hypotension  . MILD COGNITIVE IMPAIRMENT SO STATED 08/01/2008  . NEPHROLITHIASIS, HX OF 04/03/2007  . Neurasthenia 03/10/2009  . OSTEOPOROSIS 08/31/2007  . PROSTATE CANCER, HX OF 04/02/1997  . RADIATION PROCTITIS 01/14/2010  . SKIN CANCER, HX OF 04/03/2007    Had some removed   . TRANSIENT ISCHEMIC ATTACK 08/14/2009  . Lymphocytic colitis   . History of colon cancer 1960  . CHF (congestive heart failure)     History   Social History  . Marital Status: Married    Spouse Name: N/A    Number of Children: N/A  . Years of Education: N/A   Occupational History  . Retired     Counsellor   Social History Main Topics  . Smoking status: Former Smoker    Quit date: 04/07/1979  . Smokeless tobacco: Never Used  . Alcohol Use: Yes     Comment: 1 glass wine every 2-3 weeks  . Drug Use: No  . Sexual Activity: Not on file   Other Topics Concern  . Not on file   Social History Narrative  . No narrative on file    Past Surgical History  Procedure Laterality Date  . Hemicolectomy  1960's  . Lumbar laminectomy  2006  .  Prostate biopsy  1999  . Empyema drainage  1934  . Inguinal hernia repair  [id number],2956  . Eye surgery  2009    Both eyes  . Rib resection    . Cataract extraction, bilateral  11/2008    Family History  Problem Relation Age of Onset  . Heart attack Father 39  . Liver cancer Mother 46  . Colon cancer Neg Hx     No Known Allergies  Current Outpatient Prescriptions on File Prior to Visit  Medication Sig Dispense Refill  . AMBULATORY NON FORMULARY MEDICATION Pressure Vision/ Lutein- One tablet by mouth twice daily      . budesonide (ENTOCORT EC) 3 MG 24 hr capsule TAKE 3 CAPSULE BY MOUTH EVERY MORNING  90 capsule  2  . clopidogrel (PLAVIX) 75 MG tablet TAKE 1 TABLET BY MOUTH EVERY DAY  90 tablet  0  . midodrine (PROAMATINE) 10 MG tablet TAKE 1 TABLET BY MOUTH TWICE DAILY  60 tablet  2  . MULTIPLE VITAMINS PO Take by mouth.      . Omega-3 Fatty Acids (FISH OIL) 1000 MG CAPS Take 2 capsules by mouth daily.        . rivastigmine (EXELON) 9.5 mg/24hr PLACE 1 PATCH ONTO THE SKIN DAILY AS  DIRECTED  30 patch  5  . vitamin B-12 (CYANOCOBALAMIN) 500 MCG tablet Take 500 mcg by mouth daily.      . vitamin E 100 UNIT capsule Take by mouth daily.       No current facility-administered medications on file prior to visit.    BP 112/80     Review of Systems  Constitutional: Positive for activity change and fatigue. Negative for fever, chills and appetite change.  HENT: Negative for congestion, dental problem, ear pain, hearing loss, sore throat, tinnitus, trouble swallowing and voice change.   Eyes: Negative for pain, discharge and visual disturbance.  Respiratory: Negative for cough, chest tightness, wheezing and stridor.   Cardiovascular: Negative for chest pain, palpitations and leg swelling.  Gastrointestinal: Negative for nausea, vomiting, abdominal pain, diarrhea, constipation, blood in stool and abdominal distention.  Genitourinary: Negative for urgency, hematuria, flank pain, discharge,  difficulty urinating and genital sores.  Musculoskeletal: Positive for gait problem. Negative for arthralgias, back pain, joint swelling, myalgias and neck stiffness.  Skin: Negative for rash.  Neurological: Positive for weakness. Negative for dizziness, syncope, speech difficulty, numbness and headaches.  Hematological: Negative for adenopathy. Does not bruise/bleed easily.  Psychiatric/Behavioral: Positive for sleep disturbance and decreased concentration. Negative for behavioral problems and dysphoric mood. The patient is not nervous/anxious.        Objective:   Physical Exam  Constitutional: He is oriented to person, place, and time. He appears well-developed.  Elderly Appears chronically ill Blood pressure 110/70  HENT:  Head: Normocephalic.  Right Ear: External ear normal.  Left Ear: External ear normal.  Eyes: Conjunctivae and EOM are normal.  Neck: Normal range of motion.  Cardiovascular: Normal rate and normal heart sounds.   Pulmonary/Chest: He has rales.  Abdominal: Bowel sounds are normal.  Musculoskeletal: Normal range of motion. He exhibits edema. He exhibits no tenderness.  Mild right ankle and pedal edema  Neurological: He is alert and oriented to person, place, and time.  Psychiatric: He has a normal mood and affect. His behavior is normal.          Assessment & Plan:   Parkinson's disease Orthostatic hypotension fairly stable.  We'll continue Midodrine Weight loss.  We'll check laboratory update Congestive heart failure, stable  Followup neurology Flu vaccine administered We'll attempt to optimize caloric intake Recheck 3 months

## 2014-07-05 LAB — CBC WITH DIFFERENTIAL/PLATELET
BASOS ABS: 0.1 10*3/uL (ref 0.0–0.1)
BASOS PCT: 1.6 % (ref 0.0–3.0)
Eosinophils Absolute: 0.1 10*3/uL (ref 0.0–0.7)
Eosinophils Relative: 1.8 % (ref 0.0–5.0)
HEMATOCRIT: 37.8 % — AB (ref 39.0–52.0)
HEMOGLOBIN: 12.1 g/dL — AB (ref 13.0–17.0)
Lymphocytes Relative: 18.8 % (ref 12.0–46.0)
Lymphs Abs: 1.3 10*3/uL (ref 0.7–4.0)
MCHC: 32 g/dL (ref 30.0–36.0)
MCV: 100.3 fl — AB (ref 78.0–100.0)
MONO ABS: 0.6 10*3/uL (ref 0.1–1.0)
Monocytes Relative: 8.5 % (ref 3.0–12.0)
NEUTROS ABS: 4.9 10*3/uL (ref 1.4–7.7)
Neutrophils Relative %: 69.3 % (ref 43.0–77.0)
Platelets: 282 10*3/uL (ref 150.0–400.0)
RBC: 3.77 Mil/uL — AB (ref 4.22–5.81)
RDW: 15.2 % (ref 11.5–15.5)
WBC: 7 10*3/uL (ref 4.0–10.5)

## 2014-07-05 LAB — COMPREHENSIVE METABOLIC PANEL
ALK PHOS: 83 U/L (ref 39–117)
ALT: 29 U/L (ref 0–53)
AST: 30 U/L (ref 0–37)
Albumin: 3.4 g/dL — ABNORMAL LOW (ref 3.5–5.2)
BUN: 27 mg/dL — AB (ref 6–23)
CO2: 27 mEq/L (ref 19–32)
CREATININE: 1.2 mg/dL (ref 0.4–1.5)
Calcium: 9.4 mg/dL (ref 8.4–10.5)
Chloride: 106 mEq/L (ref 96–112)
GFR: 61.57 mL/min (ref >60.00)
Glucose, Bld: 99 mg/dL (ref 70–99)
Potassium: 4.5 mEq/L (ref 3.5–5.1)
Sodium: 141 mEq/L (ref 135–145)
Total Bilirubin: 0.8 mg/dL (ref 0.2–1.2)
Total Protein: 6.6 g/dL (ref 6.0–8.3)

## 2014-07-05 LAB — TSH: TSH: 1.07 u[IU]/mL (ref 0.35–4.50)

## 2014-07-19 ENCOUNTER — Telehealth: Payer: Self-pay | Admitting: Internal Medicine

## 2014-07-19 NOTE — Telephone Encounter (Signed)
On 10/20, pt had a fainting episode. Pt is ok, but got skin tear to right calf . They will be treating that w/ applicable wound care.

## 2014-07-19 NOTE — Telephone Encounter (Signed)
FYI

## 2014-07-31 ENCOUNTER — Telehealth: Payer: Self-pay | Admitting: Internal Medicine

## 2014-07-31 IMAGING — CT CT HEAD W/O CM
1 of 2 series · 13 of 30 positions shown, 17 images · non-contrast
Comparison: 09/10/2011 CT

CLINICAL DATA: 89-year-old male with altered mental status.

CT HEAD WITHOUT CONTRAST
TECHNIQUE: Contiguous axial images were obtained from the base of
the skull through the vertex without contrast.

[Series 2: brain · axial · 0.47mm/px · z∈[+121,+274]mm · 13 of 36 slices shown, 17 images]
[im 3/36  brain]
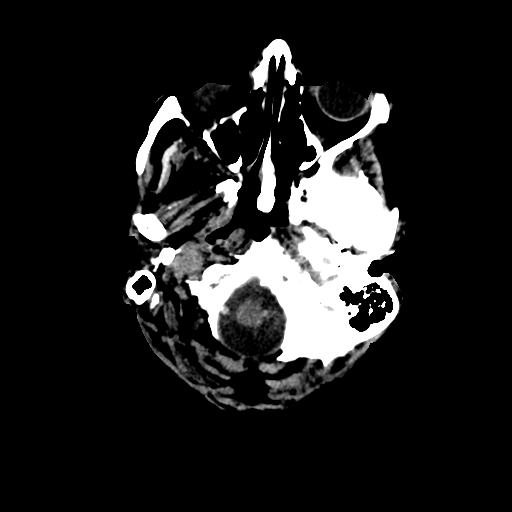
[im 3/36  bone]
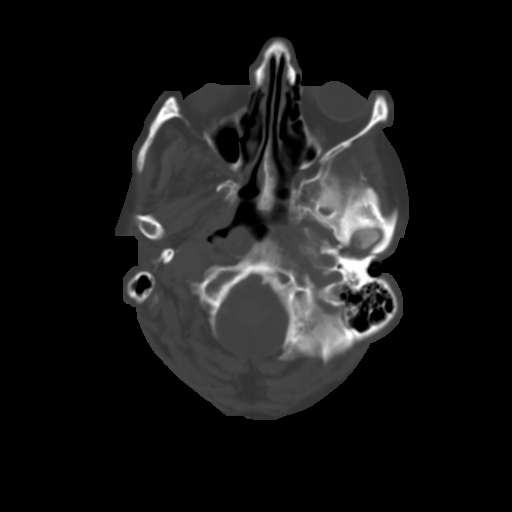
[im 6/36  brain]
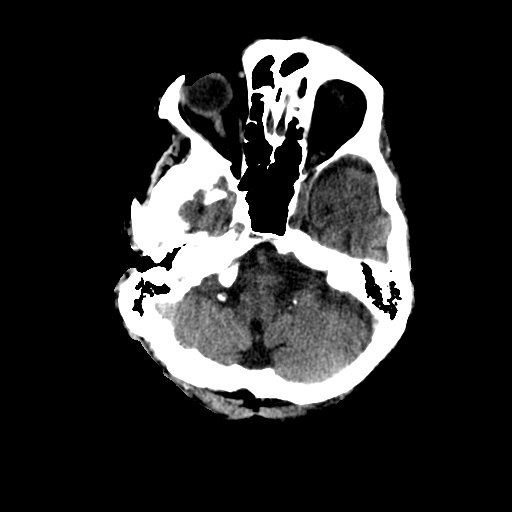
[im 8/36  brain]
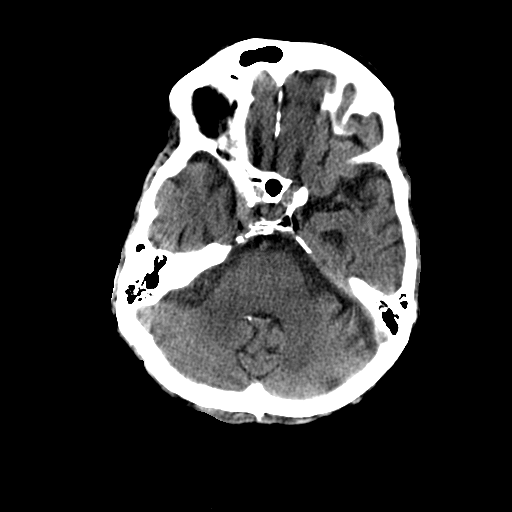
[im 11/36  brain]
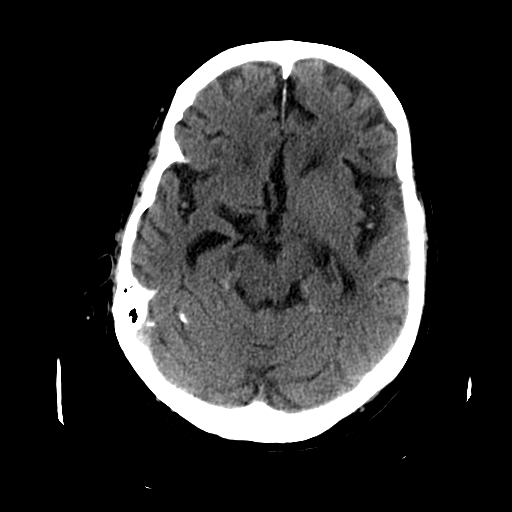
[im 13/36  brain]
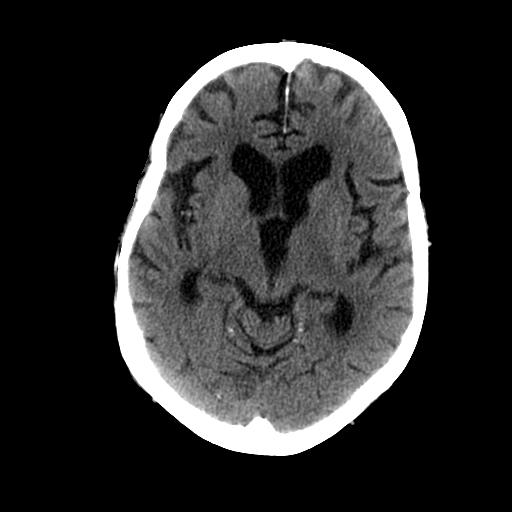
[im 13/36  bone]
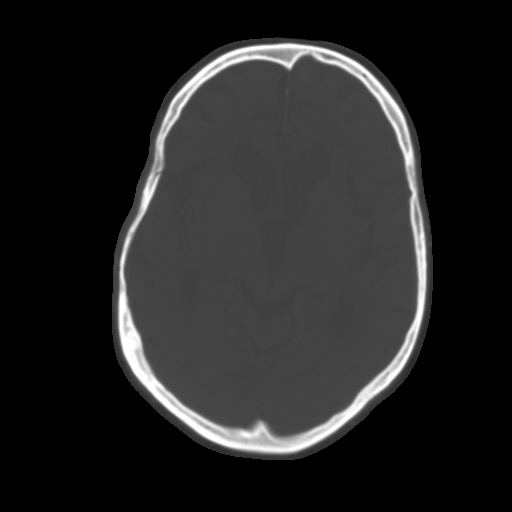
[im 16/36  brain]
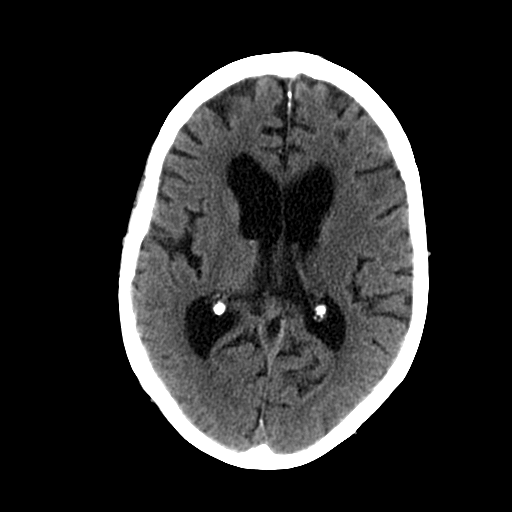
[im 18/36  brain]
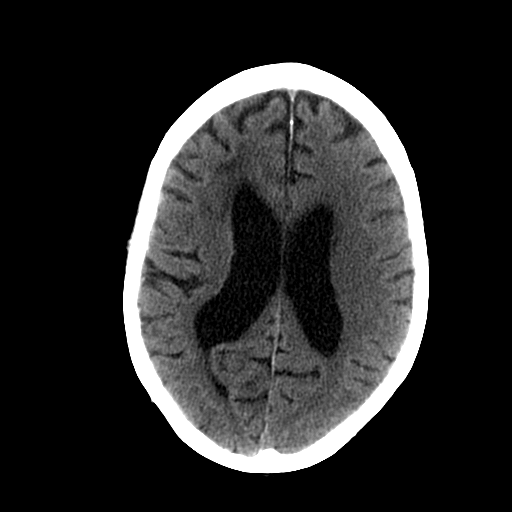
[im 21/36  brain]
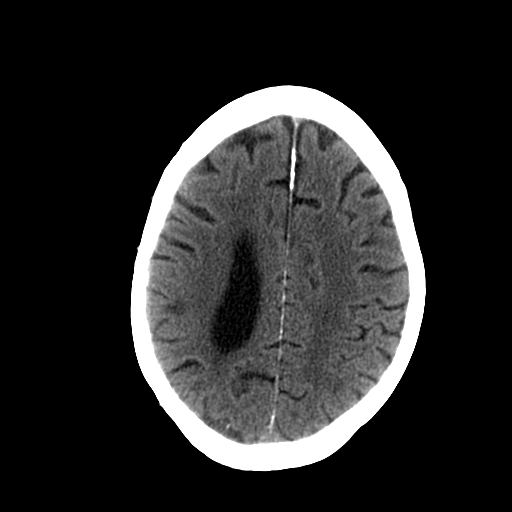
[im 23/36  brain]
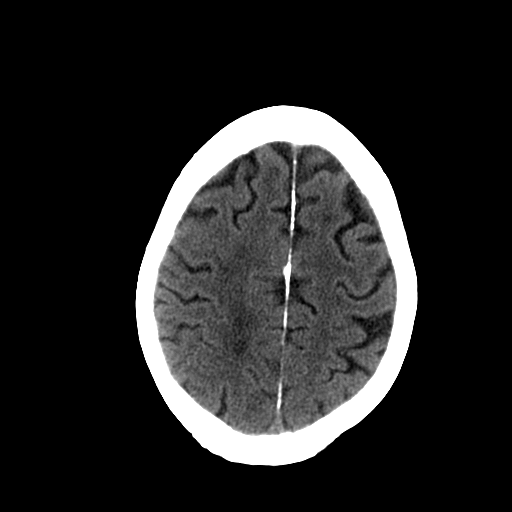
[im 23/36  bone]
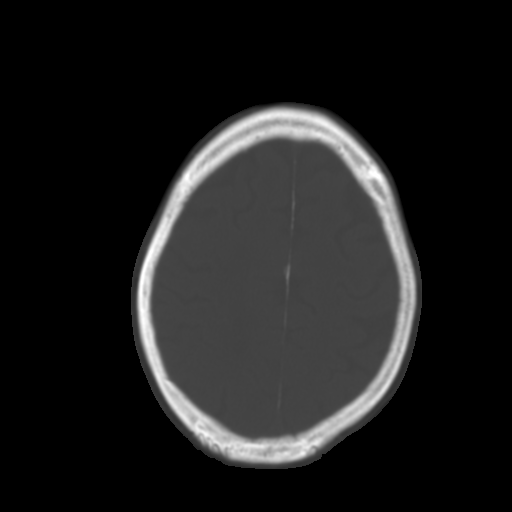
[im 26/36  brain]
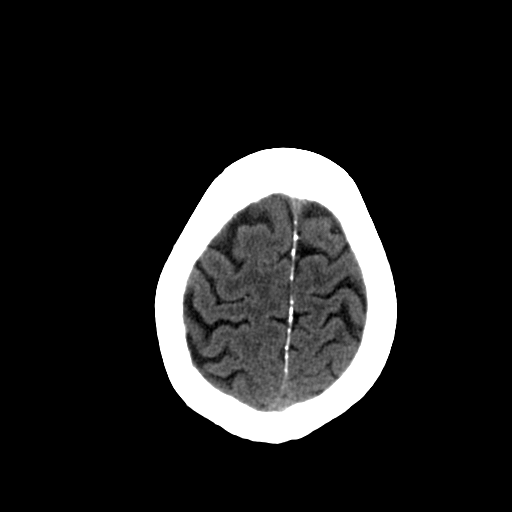
[im 28/36  brain]
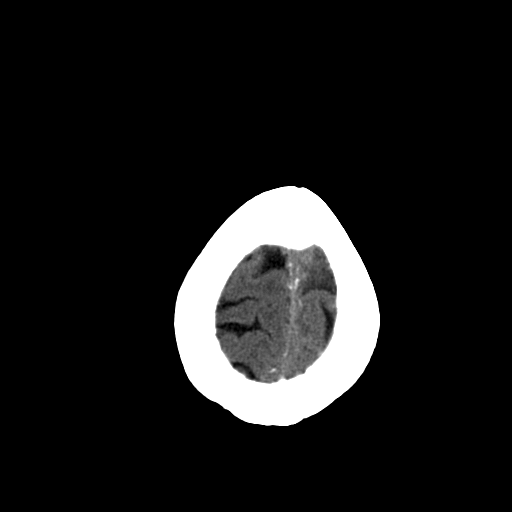
[im 31/36  brain]
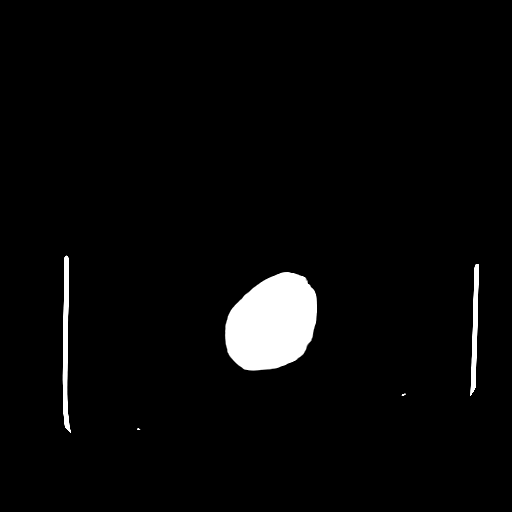
[im 33/36  brain]
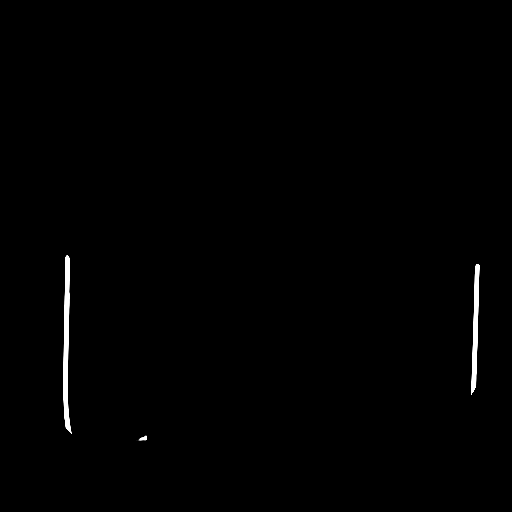
[im 33/36  bone]
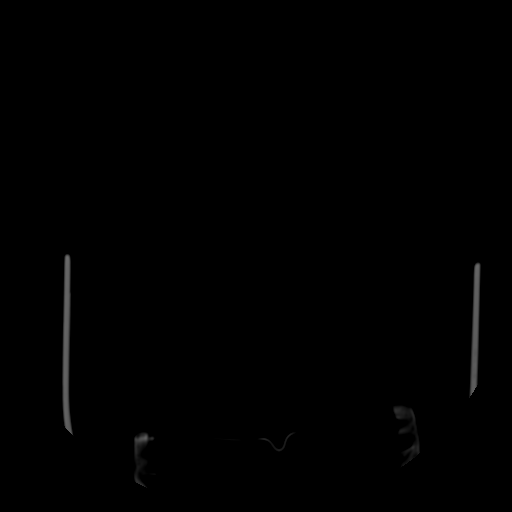

[13 of 30 positions shown; findings below may reference images not displayed]

FINDINGS: Mild generalized cerebral volume loss and mild chronic
small vessel white matter ischemic changes again noted.

No acute intracranial abnormalities are identified, including mass
lesion or mass effect, hydrocephalus, extra-axial fluid collection,
midline shift, hemorrhage, or acute infarction.

The visualized bony calvarium is unremarkable.
IMPRESSION: No evidence of acute intracranial abnormality.

## 2014-07-31 IMAGING — CR DG CHEST 1V PORT
1 series · 1 of 1 positions shown · non-contrast
Comparison: 09/10/2011

CLINICAL DATA: CHF and altered mental status.

PORTABLE CHEST - 1 VIEW

[AP]
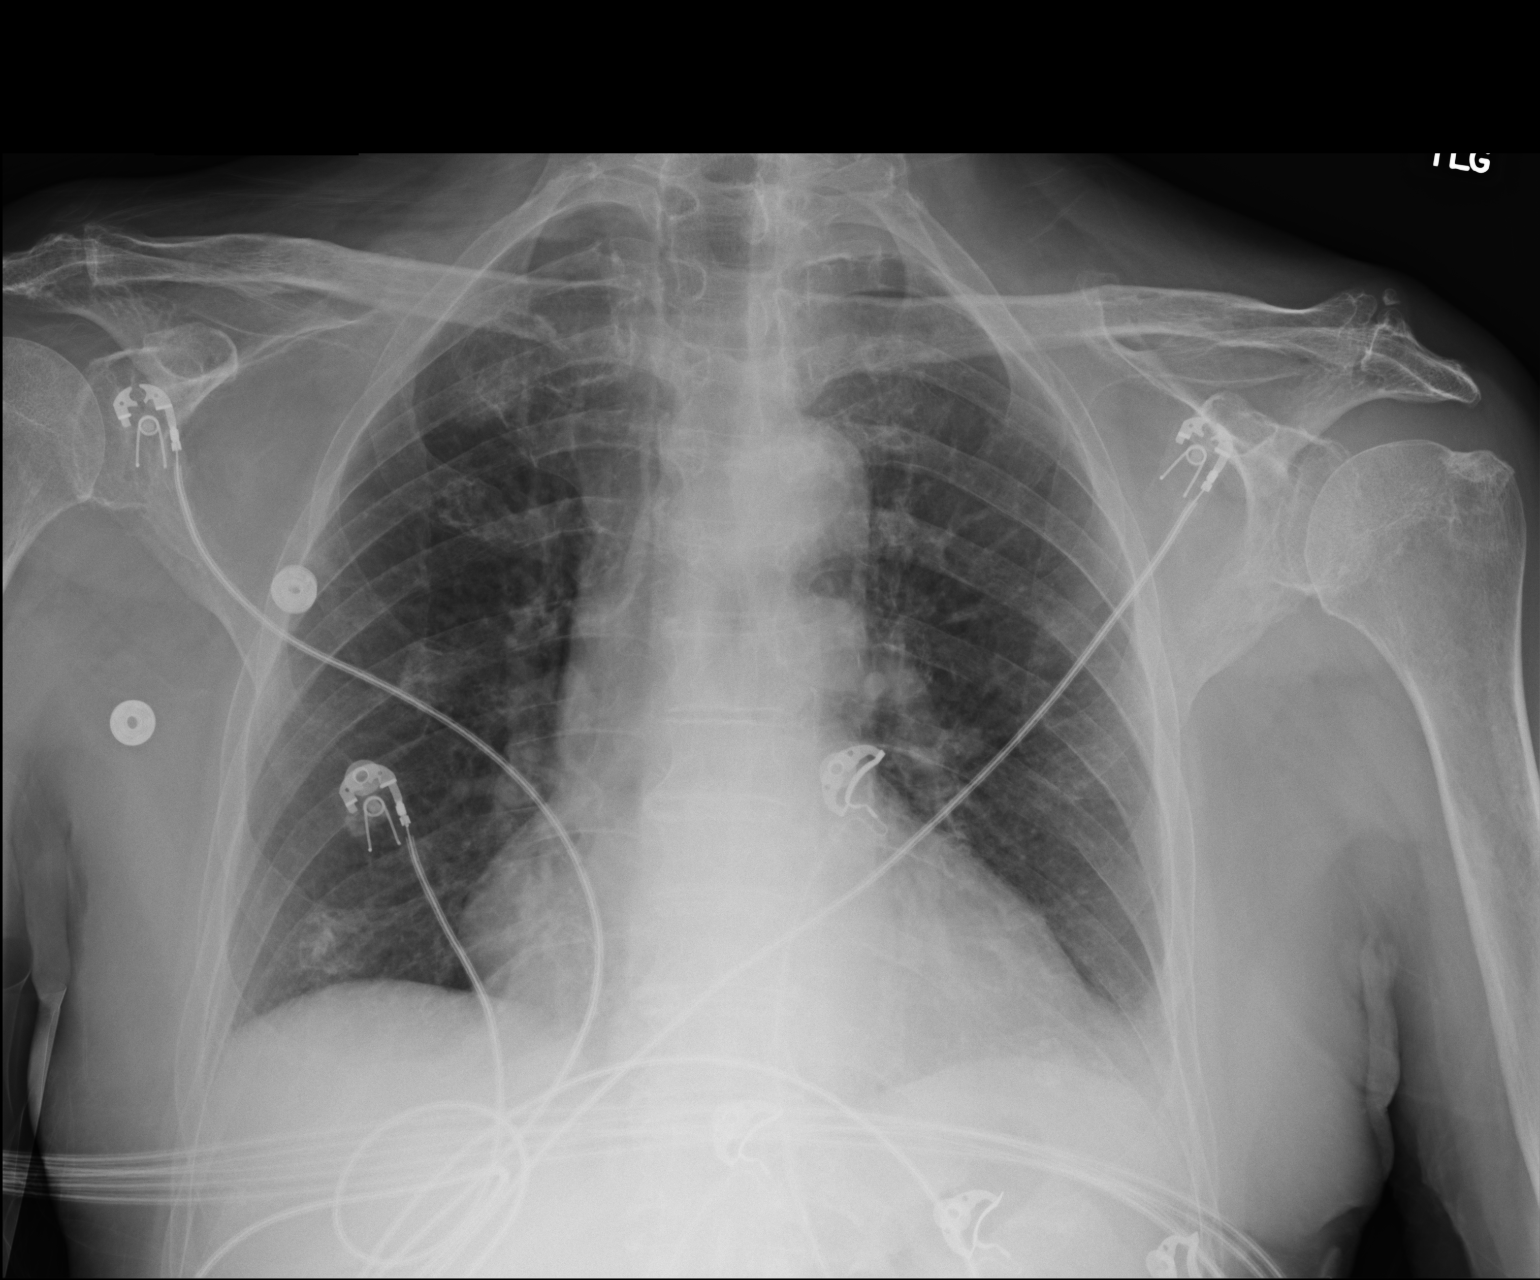

[1 of 1 positions shown; findings below may reference images not displayed]

FINDINGS: Cardiomegaly is noted.
Mild left basilar atelectasis noted.
There is no evidence of focal airspace disease, pulmonary edema,
suspicious pulmonary nodule/mass, pleural effusion, or
pneumothorax.
No acute bony abnormalities are identified.
IMPRESSION: Cardiomegaly with mild left basilar atelectasis.

## 2014-07-31 NOTE — Telephone Encounter (Signed)
Mucinex twice daily.  Okay; May want to consider Robitussin 10 cc every 6 hours as liquid alternative

## 2014-07-31 NOTE — Telephone Encounter (Signed)
Please advise 

## 2014-07-31 NOTE — Telephone Encounter (Signed)
Per carol the pt family would like to start pt on mucinex for productive cough. Pt is on plavix and had a fall 2 wks ago and becoming weaker. Please advise

## 2014-08-01 NOTE — Telephone Encounter (Signed)
Spoke to pt's son, told him Mucinex twice a day is okay, but you may want to consider Robitussin 10 cc every 6 hours as liquid alternative per Dr. Nicole CellaK.  Matt verbalized understanding.

## 2014-08-02 ENCOUNTER — Other Ambulatory Visit: Payer: Self-pay | Admitting: Internal Medicine

## 2014-08-02 MED ORDER — CLOPIDOGREL BISULFATE 75 MG PO TABS: ORAL_TABLET | ORAL | Status: AC

## 2014-08-02 NOTE — Telephone Encounter (Signed)
Rx sent to pharmacy   

## 2014-08-02 NOTE — Telephone Encounter (Signed)
[facility] [id number] - [place], [place] - [address] clopidogrel (PLAVIX) 75 MG tablet

## 2014-08-15 ENCOUNTER — Other Ambulatory Visit: Payer: Self-pay | Admitting: Internal Medicine

## 2014-08-28 ENCOUNTER — Telehealth: Payer: Self-pay | Admitting: Internal Medicine

## 2014-08-28 MED ORDER — CEFUROXIME AXETIL 500 MG PO TABS: 500.0000 mg | ORAL_TABLET | Freq: Two times a day (BID) | ORAL | Status: AC

## 2014-08-28 NOTE — Telephone Encounter (Signed)
[name] states pt is experiencing some increased coughing. Pt has course crackling in right lobe.  complaing of pain in chest w/ cough and increased SOB. Pt has been taking Robitussin.   Pt has no fever. Pt is on RA at 97%. Okey Regalarol w/ hospice would like to know if you have any other suggestions

## 2014-08-28 NOTE — Telephone Encounter (Signed)
Per Dr. Artist PaisYoo- obtain a chest x-ray if possible.  If not able to empirical treatment with Ceftin 500 mg bid #20 no RF.  An office visit is needed if cough does not improve.

## 2014-08-28 NOTE — Telephone Encounter (Signed)
Spoke with Okey Regalarol and she is aware.  She states it is hard to get pt in and out for a chest xray.  Pt will have abx from [facility].  Rx sent and [name] is aware.

## 2014-09-04 ENCOUNTER — Other Ambulatory Visit: Payer: Self-pay | Admitting: Neurology

## 2014-09-04 NOTE — Telephone Encounter (Signed)
Midodrine refill requested. Per last office note- patient to remain on medication. Refill approved and sent to patient's pharmacy.   

## 2014-09-11 ENCOUNTER — Telehealth: Payer: Self-pay | Admitting: Internal Medicine

## 2014-09-11 NOTE — Telephone Encounter (Signed)
Per Okey Regalarol patient has had an 11 pound weight gain in past 2 months, B/L edema from knees to feel, episode of SOB, increased coughing with congestion, no fever.  [name] would like a callback to advise.

## 2014-09-12 MED ORDER — FUROSEMIDE 20 MG PO TABS: 20.0000 mg | ORAL_TABLET | Freq: Every day | ORAL | Status: DC | PRN

## 2014-09-12 NOTE — Telephone Encounter (Signed)
Discussed with Dr. Tawanna Coolerodd, he ordered Lasix 20 mg one tablet in AM as needed.  Called [name] back told her Dr. Tawanna Coolerodd ordered Lasix 20 mg one tablet every morning as needed [facility]. [name] verbalized understanding.

## 2014-09-12 NOTE — Telephone Encounter (Signed)
Spoke to Emajaguaarol with Hospice, told her pt needs to be evaluated if family can get him here or can go to ED. [name] said going to the ED is not an option with family plan, she can see if family could bring him here. [name] said was wondering if maybe pt could take Lasix for few days and see if he improves. Told her Dr. Kirtland BouchardK is out of the office but I can check with Dr. Tawanna Coolerodd who is covering for him and get back to you. [name] said that would be fine.

## 2014-09-16 ENCOUNTER — Other Ambulatory Visit: Payer: Self-pay | Admitting: Internal Medicine

## 2014-09-18 ENCOUNTER — Telehealth: Payer: Self-pay | Admitting: Internal Medicine

## 2014-09-18 NOTE — Telephone Encounter (Signed)
[name] called to say that he had gained 11 lbs last week with crackles. He was started on 20mg  QD of Lasix. His weight  was 144 and is now 144.2. Please advise on dose.

## 2014-09-18 NOTE — Telephone Encounter (Signed)
Can this weight for Dr Kirtland BouchardK?

## 2014-09-18 NOTE — Telephone Encounter (Signed)
I suggest they see Dr. Kirtland BouchardK within 1 week

## 2014-09-19 ENCOUNTER — Telehealth: Payer: Self-pay | Admitting: *Deleted

## 2014-09-19 NOTE — Telephone Encounter (Signed)
ROV next week (patient has chronic rales at both bases)

## 2014-09-19 NOTE — Telephone Encounter (Signed)
Please see message and advise 

## 2014-09-19 NOTE — Telephone Encounter (Signed)
[name] with Hospice told her Dr. Kirtland BouchardK said pt needs to be seen next week, if any increase in SOB needs to go to ED. Misty StanleyLisa verbalized understanding and stated will have the family call Monday and make an appointment.

## 2014-09-19 NOTE — Telephone Encounter (Signed)
Page: 1 of 1 Call Id: [id number] [facility] - Client TELEPHONE ADVICE RECORD [facility] Patient Name: Jared GuadalajaraHOMAS Belen Gender: Male DOB: 10/15/1921 Age: [age] Y 8 M 17 D Return Phone Number: [phone] (Primary) Address: City/State/Zip: [place] Client [facility] - Client Client Site [facility] - Day Physician Alonza Smokerodd, Jeff Contact Type Call Call Type Triage / Clinical Caller Name [name]-Hospice Relationship To Patient Care Giver Return Phone Number [phone] (Primary) Chief Complaint Unclassified Symptom Initial Comment Caller states that she is doing a routine visit on a patient and the swelling has gotten worse and gained a little more weight. Wants to see what the doctor is wanting them to do now? Nurse Assessment Nurse: Kemper Durielarke, RN, Lurena Joinerebecca Date/Time Lamount Cohen(Eastern Time): 09/18/2014 4:01:25 PM Confirm and document reason for call. If symptomatic, describe symptoms. ---Caller is hospice nurse, Misty StanleyLisa states pt gained 11 pounds and started on lasix last week, pt has not lost any weight and legs edematous but no longer weeping. Lungs dim in bases and wheeze, wheezing is new. Has the patient traveled out of the country within the last 30 days? ---Not Applicable Does the patient require triage? ---No Please document clinical information provided and list any resource used. ---warm transferred Misty StanleyLisa to office to speak to Dr Tawanna Coolerodd. Guidelines Guideline Title Affirmed Question Affirmed Notes Nurse Date/Time (Eastern Time) Disp. Time Lamount Cohen(Eastern Time) Disposition Final User 09/18/2014 3:53:08 PM Send To Clinical Follow Up Denice BorsQueue Beeler, Carolyn 09/18/2014 3:53:35 PM Send To Clinical Follow Up Denice BorsQueue Beeler, Carolyn 09/18/2014 4:05:26 PM Clinical Call Yes Kemper Durielarke, RN, Lurena Joinerebecca After Care Instructions Given Call Event Type User Date / Time Description

## 2014-10-02 ENCOUNTER — Other Ambulatory Visit: Payer: Self-pay | Admitting: Internal Medicine

## 2014-10-09 ENCOUNTER — Telehealth: Payer: Self-pay | Admitting: Internal Medicine

## 2014-10-09 NOTE — Telephone Encounter (Signed)
[name] from Hospice called to say Mr Tresa EndoKelly is having increase swelling in his legs. Also having increase pain and she is having for some pain medicine.  [name]  [phone]

## 2014-10-10 ENCOUNTER — Other Ambulatory Visit: Payer: Self-pay | Admitting: Internal Medicine

## 2014-10-10 ENCOUNTER — Telehealth: Payer: Self-pay | Admitting: Internal Medicine

## 2014-10-10 MED ORDER — FUROSEMIDE 20 MG PO TABS: 20.0000 mg | ORAL_TABLET | Freq: Every day | ORAL | Status: DC | PRN

## 2014-10-10 MED ORDER — FUROSEMIDE 20 MG PO TABS: 20.0000 mg | ORAL_TABLET | Freq: Every day | ORAL | Status: AC | PRN

## 2014-10-10 NOTE — Telephone Encounter (Signed)
Spoke to Tol[place]arol with Hospice, told her Dr. Kirtland BouchardK said ROV if family desires. [name] said that is not possible pt is on decline and their Hospice physician saw pt in home yesterday and made some suggestions and did not know if okay for there physician to take of pt or if Dr. Kirtland BouchardK wants to. Told [name] if family desires their Hospice Physician that is fine and I will let Dr. Kirtland BouchardK know. [name] verbalized understanding. Dr. Kirtland BouchardK made aware.

## 2014-10-10 NOTE — Telephone Encounter (Signed)
ROV if family desires

## 2014-10-10 NOTE — Telephone Encounter (Signed)
Dr.K, please call family.

## 2014-10-10 NOTE — Telephone Encounter (Signed)
Pt states dr Kirtland Bouchardk is consulting w/ hospice dr/carol, nurse and advised he needed to see pt before any changes  can be made (lasix) swelling ,weeping,pain) Pt would like appt friday afternoon if need to discuss.  pls advise.

## 2014-10-10 NOTE — Telephone Encounter (Signed)
Called and discussed

## 2014-10-10 NOTE — Telephone Encounter (Signed)
Please see message and advise 

## 2014-10-15 ENCOUNTER — Telehealth: Payer: Self-pay | Admitting: Internal Medicine

## 2014-10-15 ENCOUNTER — Other Ambulatory Visit: Payer: Self-pay | Admitting: Internal Medicine

## 2014-10-15 NOTE — Telephone Encounter (Signed)
Verbal order left on Carol's cell voicemail

## 2014-10-15 NOTE — Telephone Encounter (Signed)
Please see message and advise 

## 2014-10-15 NOTE — Telephone Encounter (Signed)
Increase midodrine 10 mg every 4 hr while awake

## 2014-10-15 NOTE — Telephone Encounter (Signed)
[name] would like to increase midodrine pt is having increase syncopal. Please advise

## 2014-10-23 ENCOUNTER — Telehealth: Payer: Self-pay

## 2014-10-23 NOTE — Telephone Encounter (Signed)
[facility] - Client TELEPHONE ADVICE RECORD [facility] Patient Name: Jared GuadalajaraHOMAS Faw Gender: Male DOB: 06/06/1922 Age: [age] Y 9 M 23 D Return Phone Number: Address: City/State/Zip:  [place] [facility] - Client Client Site [facility] - Night Physician Derryl HarborKwiatkowski, Pete [facility] Call Type Page Only Caller Name Deanna ArtisKeisha Relationship To Patient Provider Is this call to report lab results? No Return Phone Number Unavailable Initial Comment Caller states pt died in his home 7:06 PM. Caller is Nurse, mental healthKeisha [facility] [place]. Return phone # is [phone]. Nurse Assessment Guidelines Guideline Title Affirmed Question Affirmed Notes Nurse Date/Time (Eastern Time) Disp. Time Lamount Cohen(Eastern Time) Disposition Final User 10/06/14 8:33:14 PM [facility] P[facility] Juliet RudeGhosh, Srestha 10/06/14 8:33:19 PM [facility] Juliet RudeGhosh, Srestha 10/06/14 8:38:03 PM Paged On Call back to Call Center - PC Lesia SagoBirdwell, Mary Ann 10/06/14 8:38:21 PM Page Completed Lesia SagoBirdwell, Mary Ann 10/06/14 8:38:52 PM Paged On Call back to Call Center - PC Lesia SagoBirdwell, Mary Ann 10/06/14 8:57:44 PM Paged On Call back to Call Center - PC Lesia SagoBirdwell, Mary Ann 10/06/14 8:57:59 PM Paged On Call back to Call Center - PC Lesia SagoBirdwell, Mary Ann 10/06/14 9:04:48 PM Page Completed Yes Minna Antisando, Eric After Care Instructions Given Call Event Type User Date / Time Description Paging DoctorName DoctorPhone DateTime Result/Outcome Notes Oliver BarreJohn, James [phone] 10/06/14 8:38:03 PM Called On Call Provider - Left Message Oliver BarreJohn, James [phone] 10/06/14 8:57:44 PM Called On Call Provider - Left Message Oliver BarreJohn, James 10/06/14 9:04:41 PM Spoke with On Call - General The MD called back and was connected to the caller.

## 2014-10-23 NOTE — Telephone Encounter (Signed)
FYI

## 2014-10-28 DEATH — deceased

## 2015-01-03 ENCOUNTER — Ambulatory Visit: Payer: Federal, State, Local not specified - PPO | Admitting: Internal Medicine
